# Patient Record
Sex: Male | Born: 1975 | Race: Black or African American | Hispanic: No | Marital: Single | Smoking: Former smoker
Health system: Southern US, Community
[De-identification: ages and names within clinical notes are randomized; demographics above are authoritative.]

## PROBLEM LIST (undated history)

## (undated) DIAGNOSIS — K219 Gastro-esophageal reflux disease without esophagitis: Secondary | ICD-10-CM

## (undated) DIAGNOSIS — R519 Headache, unspecified: Secondary | ICD-10-CM

## (undated) DIAGNOSIS — I82409 Acute embolism and thrombosis of unspecified deep veins of unspecified lower extremity: Secondary | ICD-10-CM

## (undated) DIAGNOSIS — R51 Headache: Secondary | ICD-10-CM

## (undated) HISTORY — DX: Acute embolism and thrombosis of unspecified deep veins of unspecified lower extremity: I82.409

---
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
## 2012-03-18 HISTORY — PX: LIPOMA EXCISION: SHX5283

## 2016-10-29 DIAGNOSIS — S93402A Sprain of unspecified ligament of left ankle, initial encounter: Secondary | ICD-10-CM | POA: Diagnosis not present

## 2016-10-30 ENCOUNTER — Other Ambulatory Visit: Payer: Self-pay | Admitting: Specialist

## 2016-10-30 DIAGNOSIS — S86012A Strain of left Achilles tendon, initial encounter: Secondary | ICD-10-CM | POA: Diagnosis not present

## 2016-10-30 DIAGNOSIS — S86019A Strain of unspecified Achilles tendon, initial encounter: Secondary | ICD-10-CM | POA: Insufficient documentation

## 2016-11-01 ENCOUNTER — Encounter
Admission: RE | Admit: 2016-11-01 | Discharge: 2016-11-01 | Disposition: A | Discharge status: Home / Self Care | Payer: BLUE CROSS/BLUE SHIELD | Source: Ambulatory Visit | Attending: Specialist | Admitting: Specialist

## 2016-11-01 HISTORY — DX: Headache: R51

## 2016-11-01 HISTORY — DX: Headache, unspecified: R51.9

## 2016-11-01 HISTORY — DX: Gastro-esophageal reflux disease without esophagitis: K21.9

## 2016-11-01 NOTE — Patient Instructions (Signed)
  Your procedure is scheduled on: 11-04-16 Report to Same Day Surgery 2nd floor medical mall [facility] Entrance-take elevator on left to 2nd floor.  Check in with surgery information desk.) To find out your arrival time please call [phone] between 1PM - 3PM on 11-01-16  Remember: Instructions that are not followed completely may result in serious medical risk, up to and including death, or upon the discretion of your surgeon and anesthesiologist your surgery may need to be rescheduled.    _x___ 1. Do not eat food or drink liquids after midnight. No gum chewing or hard candies.     __x__ 2. No Alcohol for 24 hours before or after surgery.   __x__3. No Smoking for 24 prior to surgery.   ____  4. Bring all medications with you on the day of surgery if instructed.    __x__ 5. Notify your doctor if there is any change in your medical condition     (cold, fever, infections).     Do not wear jewelry, make-up, hairpins, clips or nail polish.  Do not wear lotions, powders, or perfumes. You may wear deodorant.  Do not shave 48 hours prior to surgery. Men may shave face and neck.  Do not bring valuables to the hospital.    [facility] is not responsible for any belongings or valuables.               Contacts, dentures or bridgework may not be worn into surgery.  Leave your suitcase in the car. After surgery it may be brought to your room.  For patients admitted to the hospital, discharge time is determined by your treatment team.   Patients discharged the day of surgery will not be allowed to drive home.  You will need someone to drive you home and stay with you the night of your procedure.    Please read over the following fact sheets that you were given:   [facility] Preparing for Surgery and or MRSA Information   ____ Take anti-hypertensive (unless it includes a diuretic), cardiac, seizure, asthma,     anti-reflux and psychiatric medicines. These include:  1.  NONE  2.  3.  4.  5.  6.  ____Fleets enema or Magnesium Citrate as directed.   ____ Use CHG Soap or sage wipes as directed on instruction sheet   ____ Use inhalers on the day of surgery and bring to hospital day of surgery  ____ Stop Metformin and Janumet 2 days prior to surgery.    ____ Take 1/2 of usual insulin dose the night before surgery and none on the morning surgery.   ____ Follow recommendations from Cardiologist, Pulmonologist or PCP regarding stopping Aspirin, Coumadin, Pllavix ,Eliquis, Effient, or Pradaxa, and Pletal.  X____Stop Anti-inflammatories such as Advil, Aleve, Ibuprofen, Motrin, Naproxen, Naprosyn, Goodies powders or aspirin products NOW-OK to take Tylenol    ____ Stop supplements until after surgery.     ____ Bring C-Pap to the hospital.

## 2016-11-03 MED ORDER — CEFAZOLIN SODIUM-DEXTROSE 2-4 GM/100ML-% IV SOLN
2.0000 g | INTRAVENOUS | Status: AC
  Administered 2016-11-04: 2 g via INTRAVENOUS

## 2016-11-03 MED ORDER — CLINDAMYCIN PHOSPHATE 600 MG/50ML IV SOLN
600.0000 mg | Freq: Once | INTRAVENOUS | Status: AC
  Administered 2016-11-04: 600 mg via INTRAVENOUS

## 2016-11-04 ENCOUNTER — Ambulatory Visit
Admission: RE | Admit: 2016-11-04 | Discharge: 2016-11-04 | Disposition: A | Discharge status: Home / Self Care | Payer: BLUE CROSS/BLUE SHIELD | Source: Ambulatory Visit | Attending: Specialist | Admitting: Specialist

## 2016-11-04 ENCOUNTER — Ambulatory Visit: Payer: BLUE CROSS/BLUE SHIELD | Admitting: Certified Registered Nurse Anesthetist

## 2016-11-04 ENCOUNTER — Encounter
Admission: RE | Disposition: A | Discharge status: Home / Self Care | Payer: Self-pay | Source: Ambulatory Visit | Attending: Specialist

## 2016-11-04 DIAGNOSIS — X58XXXA Exposure to other specified factors, initial encounter: Secondary | ICD-10-CM | POA: Insufficient documentation

## 2016-11-04 DIAGNOSIS — S86012A Strain of left Achilles tendon, initial encounter: Secondary | ICD-10-CM | POA: Insufficient documentation

## 2016-11-04 HISTORY — PX: ACHILLES TENDON SURGERY: SHX542

## 2016-11-04 SURGERY — REPAIR, TENDON, ACHILLES
Anesthesia: General | Site: Foot | Laterality: Left | Wound class: Clean

## 2016-11-04 MED ORDER — ROCURONIUM BROMIDE 100 MG/10ML IV SOLN
INTRAVENOUS | Status: DC | PRN
  Administered 2016-11-04: 30 mg via INTRAVENOUS

## 2016-11-04 MED ORDER — BUPIVACAINE HCL (PF) 0.5 % IJ SOLN
INTRAMUSCULAR | Status: AC
  Filled 2016-11-04: qty 30

## 2016-11-04 MED ORDER — PROPOFOL 10 MG/ML IV BOLUS
INTRAVENOUS | Status: DC | PRN
  Administered 2016-11-04: 200 mg via INTRAVENOUS

## 2016-11-04 MED ORDER — ONDANSETRON HCL 4 MG/2ML IJ SOLN
INTRAMUSCULAR | Status: AC
  Filled 2016-11-04: qty 2

## 2016-11-04 MED ORDER — GABAPENTIN 400 MG PO CAPS
400.0000 mg | ORAL_CAPSULE | ORAL | Status: AC
  Administered 2016-11-04: 400 mg via ORAL

## 2016-11-04 MED ORDER — FAMOTIDINE 20 MG PO TABS
ORAL_TABLET | ORAL | Status: AC
  Administered 2016-11-04: 20 mg via ORAL
  Filled 2016-11-04: qty 1

## 2016-11-04 MED ORDER — GABAPENTIN 400 MG PO CAPS
ORAL_CAPSULE | ORAL | Status: AC
  Administered 2016-11-04: 400 mg via ORAL
  Filled 2016-11-04: qty 1

## 2016-11-04 MED ORDER — HYDROCODONE-ACETAMINOPHEN 5-325 MG PO TABS: 1.0000 | ORAL_TABLET | Freq: Four times a day (QID) | ORAL | 0 refills | Status: DC | PRN

## 2016-11-04 MED ORDER — SUCCINYLCHOLINE CHLORIDE 20 MG/ML IJ SOLN
INTRAMUSCULAR | Status: DC | PRN
  Administered 2016-11-04: 100 mg via INTRAVENOUS

## 2016-11-04 MED ORDER — MELOXICAM 7.5 MG PO TABS
ORAL_TABLET | ORAL | Status: AC
  Administered 2016-11-04: 15 mg via ORAL
  Filled 2016-11-04: qty 2

## 2016-11-04 MED ORDER — MIDAZOLAM HCL 2 MG/2ML IJ SOLN
INTRAMUSCULAR | Status: DC | PRN
  Administered 2016-11-04: 2 mg via INTRAVENOUS

## 2016-11-04 MED ORDER — GABAPENTIN 400 MG PO CAPS: 400.0000 mg | ORAL_CAPSULE | Freq: Two times a day (BID) | ORAL | 3 refills | Status: DC

## 2016-11-04 MED ORDER — NEOMYCIN-POLYMYXIN B GU 40-200000 IR SOLN
Status: AC
  Filled 2016-11-04: qty 4

## 2016-11-04 MED ORDER — LIDOCAINE HCL (CARDIAC) 20 MG/ML IV SOLN
INTRAVENOUS | Status: DC | PRN
  Administered 2016-11-04: 100 mg via INTRAVENOUS

## 2016-11-04 MED ORDER — ONDANSETRON HCL 4 MG/2ML IJ SOLN: 4.0000 mg | Freq: Once | INTRAMUSCULAR | Status: DC | PRN

## 2016-11-04 MED ORDER — CLINDAMYCIN PHOSPHATE 600 MG/50ML IV SOLN
INTRAVENOUS | Status: AC
  Filled 2016-11-04: qty 50

## 2016-11-04 MED ORDER — FENTANYL CITRATE (PF) 100 MCG/2ML IJ SOLN
INTRAMUSCULAR | Status: AC
  Filled 2016-11-04: qty 2

## 2016-11-04 MED ORDER — ACETAMINOPHEN 10 MG/ML IV SOLN
INTRAVENOUS | Status: AC
  Filled 2016-11-04: qty 100

## 2016-11-04 MED ORDER — CEFAZOLIN SODIUM-DEXTROSE 2-4 GM/100ML-% IV SOLN
INTRAVENOUS | Status: AC
  Filled 2016-11-04: qty 100

## 2016-11-04 MED ORDER — MELOXICAM 7.5 MG PO TABS
15.0000 mg | ORAL_TABLET | Freq: Once | ORAL | Status: AC
  Administered 2016-11-04: 15 mg via ORAL

## 2016-11-04 MED ORDER — FAMOTIDINE 20 MG PO TABS
20.0000 mg | ORAL_TABLET | Freq: Once | ORAL | Status: AC
  Administered 2016-11-04: 20 mg via ORAL

## 2016-11-04 MED ORDER — PROPOFOL 10 MG/ML IV BOLUS
INTRAVENOUS | Status: AC
  Filled 2016-11-04: qty 20

## 2016-11-04 MED ORDER — DEXAMETHASONE SODIUM PHOSPHATE 10 MG/ML IJ SOLN
INTRAMUSCULAR | Status: AC
  Filled 2016-11-04: qty 1

## 2016-11-04 MED ORDER — FENTANYL CITRATE (PF) 100 MCG/2ML IJ SOLN
INTRAMUSCULAR | Status: DC | PRN
  Administered 2016-11-04: 50 ug via INTRAVENOUS
  Administered 2016-11-04 (×2): 100 ug via INTRAVENOUS

## 2016-11-04 MED ORDER — DEXAMETHASONE SODIUM PHOSPHATE 10 MG/ML IJ SOLN
INTRAMUSCULAR | Status: DC | PRN
  Administered 2016-11-04: 10 mg via INTRAVENOUS

## 2016-11-04 MED ORDER — BUPIVACAINE HCL (PF) 0.5 % IJ SOLN
INTRAMUSCULAR | Status: DC | PRN
  Administered 2016-11-04: 30 mL

## 2016-11-04 MED ORDER — NEOMYCIN-POLYMYXIN B GU 40-200000 IR SOLN
Status: DC | PRN
  Administered 2016-11-04: 4 mL

## 2016-11-04 MED ORDER — CHLORHEXIDINE GLUCONATE CLOTH 2 % EX PADS
6.0000 | MEDICATED_PAD | Freq: Once | CUTANEOUS | Status: AC
  Administered 2016-11-04: 6 via TOPICAL

## 2016-11-04 MED ORDER — FENTANYL CITRATE (PF) 100 MCG/2ML IJ SOLN: 25.0000 ug | INTRAMUSCULAR | Status: DC | PRN

## 2016-11-04 MED ORDER — FENTANYL CITRATE (PF) 250 MCG/5ML IJ SOLN
INTRAMUSCULAR | Status: AC
  Filled 2016-11-04: qty 5

## 2016-11-04 MED ORDER — MIDAZOLAM HCL 2 MG/2ML IJ SOLN
INTRAMUSCULAR | Status: AC
  Filled 2016-11-04: qty 2

## 2016-11-04 MED ORDER — LACTATED RINGERS IV SOLN
INTRAVENOUS | Status: DC
  Administered 2016-11-04: 14:00:00 via INTRAVENOUS

## 2016-11-04 MED ORDER — LIDOCAINE HCL (PF) 2 % IJ SOLN
INTRAMUSCULAR | Status: AC
  Filled 2016-11-04: qty 2

## 2016-11-04 MED ORDER — MELOXICAM 15 MG PO TABS: 15.0000 mg | ORAL_TABLET | Freq: Every day | ORAL | 3 refills | Status: DC

## 2016-11-04 MED ORDER — ONDANSETRON HCL 4 MG/2ML IJ SOLN
INTRAMUSCULAR | Status: DC | PRN
  Administered 2016-11-04: 4 mg via INTRAVENOUS

## 2016-11-04 MED ORDER — SUGAMMADEX SODIUM 200 MG/2ML IV SOLN
INTRAVENOUS | Status: DC | PRN
  Administered 2016-11-04: 200 mg via INTRAVENOUS

## 2016-11-04 SURGICAL SUPPLY — 43 items
BNDG COHESIVE 4X5 TAN STRL (GAUZE/BANDAGES/DRESSINGS) ×2 IMPLANT
BNDG ESMARK 6X12 TAN STRL LF (GAUZE/BANDAGES/DRESSINGS) ×2 IMPLANT
CANISTER SUCT 1200ML W/VALVE (MISCELLANEOUS) ×2 IMPLANT
CASTING MATERIAL DELTA LITE (CAST SUPPLIES) ×8 IMPLANT
CHLORAPREP W/TINT 26ML (MISCELLANEOUS) ×2 IMPLANT
CUFF TOURN 24 STER (MISCELLANEOUS) IMPLANT
CUFF TOURN 30 STER DUAL PORT (MISCELLANEOUS) ×2 IMPLANT
ELECT REM PT RETURN 9FT ADLT (ELECTROSURGICAL) ×2
ELECTRODE REM PT RTRN 9FT ADLT (ELECTROSURGICAL) ×1 IMPLANT
GAUZE PETRO XEROFOAM 1X8 (MISCELLANEOUS) ×2 IMPLANT
GAUZE SPONGE 4X4 12PLY STRL (GAUZE/BANDAGES/DRESSINGS) ×2 IMPLANT
GAUZE XEROFORM 4X4 STRL (GAUZE/BANDAGES/DRESSINGS) ×2 IMPLANT
GLOVE BIO SURGEON STRL SZ8 (GLOVE) ×2 IMPLANT
GLOVE INDICATOR 8.0 STRL GRN (GLOVE) ×2 IMPLANT
GLOVE SURG ORTHO 8.5 STRL (GLOVE) ×2 IMPLANT
GOWN STRL REUS W/ TWL LRG LVL3 (GOWN DISPOSABLE) ×1 IMPLANT
GOWN STRL REUS W/TWL LRG LVL3 (GOWN DISPOSABLE) ×1
GOWN STRL REUS W/TWL LRG LVL4 (GOWN DISPOSABLE) ×2 IMPLANT
KIT RM TURNOVER STRD PROC AR (KITS) ×2 IMPLANT
LABEL OR SOLS (LABEL) ×2 IMPLANT
NEEDLE SPNL 20GX3.5 QUINCKE YW (NEEDLE) ×2 IMPLANT
NS IRRIG 1000ML POUR BTL (IV SOLUTION) ×2 IMPLANT
PACK EXTREMITY ARMC (MISCELLANEOUS) ×2 IMPLANT
PADDING CAST 4IN STRL (MISCELLANEOUS) ×2
PADDING CAST BLEND 4X4 STRL (MISCELLANEOUS) ×2 IMPLANT
PADDING CAST BLEND 6X4 STRL (MISCELLANEOUS) ×2 IMPLANT
PADDING STRL CAST 6IN (MISCELLANEOUS) ×2
SOL PREP PVP 2OZ (MISCELLANEOUS) ×2
SOLUTION PREP PVP 2OZ (MISCELLANEOUS) ×1 IMPLANT
SPONGE LAP 18X18 5 PK (GAUZE/BANDAGES/DRESSINGS) ×2 IMPLANT
STAPLER SKIN PROX 35W (STAPLE) ×2 IMPLANT
STOCKINETTE BIAS CUT 6 980064 (GAUZE/BANDAGES/DRESSINGS) ×2 IMPLANT
STOCKINETTE STRL 6IN 960660 (GAUZE/BANDAGES/DRESSINGS) ×2 IMPLANT
SUT DVC QUILL MONODERM 30X30 (SUTURE) ×2 IMPLANT
SUT ETHIBOND 3-0  EXTR (SUTURE) ×1
SUT ETHIBOND 3-0 EXTR (SUTURE) ×1 IMPLANT
SUT FIBERWIRE #2 38 BLUE 1/2 (SUTURE) ×2
SUT ORTHOCORD OS-6 NDL 36 (SUTURE) ×2 IMPLANT
SUT QUILL PDO 0 36 36 VIOLET (SUTURE) ×2 IMPLANT
SUT VIC AB 3-0 SH 27 (SUTURE) ×1
SUT VIC AB 3-0 SH 27X BRD (SUTURE) ×1 IMPLANT
SUTURE FIBERWR #2 38 BLUE 1/2 (SUTURE) ×1 IMPLANT
TAPE CAST 4X4 WHT DELT (MISCELLANEOUS) ×8 IMPLANT

## 2016-11-04 NOTE — Anesthesia Postprocedure Evaluation (Signed)
Anesthesia Post Note  Patient: Frank Marsh  Procedure(s) Performed: Procedure(s) (LRB): ACHILLES TENDON REPAIR (Left)  Patient location during evaluation: PACU Anesthesia Type: General Level of consciousness: awake and alert and oriented Pain management: pain level controlled Vital Signs Assessment: post-procedure vital signs reviewed and stable Respiratory status: spontaneous breathing, nonlabored ventilation and respiratory function stable Cardiovascular status: blood pressure returned to baseline and stable Postop Assessment: no signs of nausea or vomiting Anesthetic complications: no     Last Vitals:  Vitals:   11/04/16 1646 11/04/16 1657  BP: (!) 143/89 (!) 145/80  Pulse: 84 80  Resp: 20 18  Temp:  36.5 C  SpO2: 95% 96%    Last Pain:  Vitals:   11/04/16 1657  TempSrc: Temporal  PainSc:                  [name]

## 2016-11-04 NOTE — Anesthesia Post-op Follow-up Note (Signed)
Anesthesia QCDR form completed.        

## 2016-11-04 NOTE — Op Note (Signed)
11/04/2016  3:49 PM  PATIENT:  Frank Marsh    PRE-OPERATIVE DIAGNOSIS:  S86.012A Strain of left Achilles tendon, initial encounter  POST-OPERATIVE DIAGNOSIS:  Same  PROCEDURE:  ACHILLES TENDON REPAIR  SURGEON:  Park Breed, MD  ANESTHESIA:   General, prone  PREOPERATIVE INDICATIONS:  Frank Marsh is a  41 y.o. male with a diagnosis of S86.012A Strain of left Achilles tendon, initial encounter who was diagnosed with a complete Achilles tendon rupture and elected for surgical management.    The risks benefits and alternatives were discussed with the patient preoperatively including but not limited to the risks of infection, bleeding, nerve injury, cardiopulmonary complications, the need for revision surgery, among others, and the patient was willing to proceed.  EBL: None  TOURNIQUET TIME: 32  MIN  OPERATIVE IMPLANTS: None  OPERATIVE FINDINGS: Complete rupture of left Achilles at musculotendinous junction  OPERATIVE PROCEDURE: The patient was brought to the operating room and underwent general endotracheal anesthesia.  The patient was then turned into the prone position on the operating room table and padded appropriately.  The operative leg was prepped and draped in a sterile fashion and an Esmarch bandage applied.  Tourniquet was inflated to 350 mmHg.  A posterior lateral incision was made along the distal aspect of the Achilles tendon.  Dissection was carried out bluntly through subcutaneous tissue, preserving veins and a large sural  nerve.  The tendon sheath was then incised longitudinally after freeing it up from adhesions.  As much sheath as possible was preserved.  The Achilles tendon was seen to be completely ruptured.  The wound was irrigated free of debris and blood.  The tendon ends were debrided with scissors to remove frayed tissue.  Once this was accomplished, the tendon ends were reapproximated using a #2 Fiberwire suture in a modified Lister fashion, which was tied  securely.  This provided good continuity.  The tendon ends were then sutured further with #2 Fiberwire suture. A circumferential 2-0 vicryl was placed.  After further irrigation, the tendon sheath was closed with 2-0 Vicryl suture. Subcutaneous tissue was closed with 3-0 vicryl. The skin was closed with staples.  Half percent Sensorcaine was placed into the wound.  A dry sterile dressing with a well-padded short-leg cast was applied.  The ankle was allowed to drop into a comfortable amount of equinus.  Tourniquet was deflated with good return of blood flow to the foot.  Sponge and needle counts were correct.  Patient was awakened and returned to the supine position on a stretcher and taken to recovery in good condition.    Park Breed, MD

## 2016-11-04 NOTE — Anesthesia Preprocedure Evaluation (Addendum)
Anesthesia Evaluation  Patient identified by MRN, date of birth, ID band Patient awake    Reviewed: Allergy & Precautions, NPO status , Patient's Chart, lab work & pertinent test results, reviewed documented beta blocker date and time   Airway Mallampati: II  TM Distance: >3 FB     Dental  (+) Chipped, Missing   Pulmonary former smoker,           Cardiovascular      Neuro/Psych  Headaches,    GI/Hepatic GERD  Controlled,  Endo/Other    Renal/GU      Musculoskeletal   Abdominal   Peds  Hematology   Anesthesia Other Findings One tooth missing.  Reproductive/Obstetrics                            Anesthesia Physical Anesthesia Plan  ASA: III  Anesthesia Plan: General   Post-op Pain Management:    Induction: Intravenous  PONV Risk Score and Plan:   Airway Management Planned: Oral ETT and LMA  Additional Equipment:   Intra-op Plan:   Post-operative Plan:   Informed Consent: I have reviewed the patients History and Physical, chart, labs and discussed the procedure including the risks, benefits and alternatives for the proposed anesthesia with the patient or authorized representative who has indicated his/her understanding and acceptance.     Plan Discussed with: CRNA  Anesthesia Plan Comments:         Anesthesia Quick Evaluation

## 2016-11-04 NOTE — Transfer of Care (Signed)
Immediate Anesthesia Transfer of Care Note  Patient: Frank Marsh  Procedure(s) Performed: Procedure(s): ACHILLES TENDON REPAIR (Left)  Patient Location: PACU  Anesthesia Type:General  Level of Consciousness: drowsy and patient cooperative  Airway & Oxygen Therapy: Patient Spontanous Breathing and Patient connected to face mask oxygen  Post-op Assessment: Report given to RN and Post -op Vital signs reviewed and stable  Post vital signs: Reviewed and stable  Last Vitals:  Vitals:   11/04/16 1337 11/04/16 1605  BP: 138/79 132/87  Pulse: 88 84  Resp: 18 17  Temp: 37 C 36.5 C  SpO2: 99% 98%    Last Pain:  Vitals:   11/04/16 1605  TempSrc: Temporal  PainSc: 0-No pain         Complications: No apparent anesthesia complications

## 2016-11-04 NOTE — H&P (Signed)
THE PATIENT WAS SEEN PRIOR TO SURGERY TODAY.  HISTORY, ALLERGIES, HOME MEDICATIONS AND OPERATIVE PROCEDURE WERE REVIEWED. RISKS AND BENEFITS OF SURGERY DISCUSSED WITH PATIENT AGAIN.  NO CHANGES FROM INITIAL HISTORY AND PHYSICAL NOTED.    

## 2016-11-04 NOTE — Anesthesia Procedure Notes (Signed)
Procedure Name: Intubation Date/Time: 11/04/2016 2:50 PM Performed by: Darlyne Russian Pre-anesthesia Checklist: Patient identified, Emergency Drugs available, Suction available, Patient being monitored and Timeout performed Patient Re-evaluated:Patient Re-evaluated prior to induction Oxygen Delivery Method: Circle system utilized Preoxygenation: Pre-oxygenation with 100% oxygen Induction Type: IV induction Ventilation: Mask ventilation without difficulty Laryngoscope Size: Mac and 4 Grade View: Grade III Tube type: Oral Tube size: 7.5 mm Number of attempts: 1 Airway Equipment and Method: Stylet Secured at: 21 cm Tube secured with: Tape Dental Injury: Teeth and Oropharynx as per pre-operative assessment

## 2016-11-04 NOTE — Progress Notes (Signed)
Elevated leg on pillows

## 2016-11-05 ENCOUNTER — Encounter: Payer: Self-pay | Admitting: Specialist

## 2016-11-07 ENCOUNTER — Ambulatory Visit: Payer: Self-pay | Admitting: Family Medicine

## 2016-11-19 DIAGNOSIS — S86012A Strain of left Achilles tendon, initial encounter: Secondary | ICD-10-CM | POA: Diagnosis not present

## 2016-11-23 ENCOUNTER — Emergency Department: Payer: BLUE CROSS/BLUE SHIELD

## 2016-11-23 ENCOUNTER — Emergency Department
Admission: EM | Admit: 2016-11-23 | Discharge: 2016-11-23 | Disposition: A | Discharge status: Home / Self Care | Payer: BLUE CROSS/BLUE SHIELD | Attending: Emergency Medicine | Admitting: Emergency Medicine

## 2016-11-23 DIAGNOSIS — I82409 Acute embolism and thrombosis of unspecified deep veins of unspecified lower extremity: Secondary | ICD-10-CM | POA: Insufficient documentation

## 2016-11-23 DIAGNOSIS — Z87891 Personal history of nicotine dependence: Secondary | ICD-10-CM | POA: Diagnosis not present

## 2016-11-23 DIAGNOSIS — I8290 Acute embolism and thrombosis of unspecified vein: Secondary | ICD-10-CM | POA: Diagnosis not present

## 2016-11-23 DIAGNOSIS — M79662 Pain in left lower leg: Secondary | ICD-10-CM | POA: Diagnosis not present

## 2016-11-23 DIAGNOSIS — M79605 Pain in left leg: Secondary | ICD-10-CM | POA: Diagnosis not present

## 2016-11-23 DIAGNOSIS — Z79899 Other long term (current) drug therapy: Secondary | ICD-10-CM | POA: Insufficient documentation

## 2016-11-23 MED ORDER — RIVAROXABAN (XARELTO) VTE STARTER PACK (15 & 20 MG): ORAL_TABLET | ORAL | 0 refills | Status: DC

## 2016-11-23 NOTE — ED Notes (Signed)
Patient transported to Ultrasound 

## 2016-11-23 NOTE — ED Triage Notes (Signed)
Pt c/o left groin pain worse with movement X 3 days. Pt has cast on left lower extremity after achilles tendon surgery in August. No redness or swelling to groin.

## 2016-11-23 NOTE — ED Provider Notes (Signed)
I was consulted by PA regarding if need to remove cast and whether OK to discharge on Xarelto (as opposed to inpatient anticoag).  We discussed to make sure no lower leg pain or tingling in the cast -- I was told No by PA, and in that case I think could be reasonable to leave cast in place and recommend to patient any symptoms at the leg in the cast to be seen immediately for reevaluation.  New DVT, otherwise stable without symptoms concerning for PE is generally ok for outpatient initiation of anticoagulation.   Lisa Roca, MD 11/23/16 [phone]

## 2016-11-23 NOTE — ED Provider Notes (Signed)
[facility] Emergency Department Provider Note  ____________________________________________  Time seen: Approximately 10:19 AM  I have reviewed the triage vital signs and the nursing notes.   HISTORY  Chief Complaint Groin Pain    HPI Frank Marsh is a 41 y.o. male that presents to the EDfor evaluation of left leg pain for 4 days. Patient had achilles tendon repair in August. Patient had staples removed on Tuesday and had his cast replaced. Pain started in his calf and is now on the inside of his left thigh. Pain is worse with movement. No new injury. No SOB, CP, nausea, vomiting, abdominal pain, numbness, tingling.   Past Medical History:  Diagnosis Date  . GERD (gastroesophageal reflux disease)    OCC-NO MEDS  . Headache    OCC    There are no active problems to display for this patient.   Past Surgical History:  Procedure Laterality Date  . ACHILLES TENDON SURGERY Left 11/04/2016   Procedure: ACHILLES TENDON REPAIR;  Surgeon: Earnestine Leys, MD;  Location: [facility] ORS;  Service: Orthopedics;  Laterality: Left;  . HERNIA REPAIR     AS A BABY  . LIPOMA EXCISION Right    LEG    Prior to Admission medications   Medication Sig Start Date End Date Taking? Authorizing Provider  acetaminophen (TYLENOL) 325 MG tablet Take 650 mg by mouth every 4 (four) hours as needed.    [provider]  gabapentin (NEURONTIN) 400 MG capsule Take 1 capsule (400 mg total) by mouth 2 (two) times daily. 11/04/16   Earnestine Leys, MD  HYDROcodone-acetaminophen (NORCO) 5-325 MG tablet Take 1 tablet by mouth every 6 (six) hours as needed. 11/04/16   Earnestine Leys, MD  meloxicam (MOBIC) 15 MG tablet Take 1 tablet (15 mg total) by mouth daily. 11/04/16   Earnestine Leys, MD  Rivaroxaban 15 & 20 MG TBPK Take as directed on package: Start with one 15mg  tablet by mouth twice a day with food. On Day 22, switch to one 20mg  tablet once a day with food. 11/23/16   Laban Emperor,  PA-C    Allergies Patient has no known allergies.  No family history on file.  Social History Social History  Substance Use Topics  . Smoking status: Former Smoker    Packs/day: 1.00    Years: 15.00    Types: Cigarettes    Quit date: 11/01/2009  . Smokeless tobacco: Never Used  . Alcohol use No     Review of Systems  Constitutional: No fever/chills Cardiovascular: No chest pain. Respiratory: No cough. No SOB. Gastrointestinal: No abdominal pain.  No nausea, no vomiting.  Skin: Negative for rash, abrasions, lacerations, ecchymosis. Neurological: Negative for headaches, numbness or tingling   ____________________________________________   PHYSICAL EXAM:  VITAL SIGNS: ED Triage Vitals  Enc Vitals Group     BP 11/23/16 1000 (!) 149/82     Pulse Rate 11/23/16 1000 94     Resp 11/23/16 1000 18     Temp 11/23/16 1000 98.7 F (37.1 C)     Temp Source 11/23/16 1000 Oral     SpO2 11/23/16 1000 98 %     Weight 11/23/16 1001 225 lb (102.1 kg)     Height 11/23/16 1001 5\' 10"  (1.778 m)     Head Circumference --      Peak Flow --      Pain Score 11/23/16 1000 8     Pain Loc --      Pain Edu? --  Excl. in [place]? --      Constitutional: Alert and oriented. Well appearing and in no acute distress. Eyes: Conjunctivae are normal. PERRL. EOMI. Head: Atraumatic. ENT:      Ears:      Nose: No congestion/rhinnorhea.      Mouth/Throat: Mucous membranes are moist.  Neck: No stridor.  Cardiovascular: Normal rate, regular rhythm.  Good peripheral circulation. Respiratory: Normal respiratory effort without tachypnea or retractions. Lungs CTAB. Good air entry to the bases with no decreased or absent breath sounds. Gastrointestinal: Bowel sounds 4 quadrants. Soft and nontender to palpation. No guarding or rigidity. No palpable masses. No distention.  Musculoskeletal:  No gross deformities appreciated.   Tenderness to palpation over left adductor muscles. Cast in place. No visible  swelling or erythema. Sensation of toes and cap refill intact. Neurologic:  Normal speech and language. No gross focal neurologic deficits are appreciated.  Skin:  Skin is warm, dry and intact. No rash noted.   ____________________________________________   LABS (all labs ordered are listed, but only abnormal results are displayed)  Labs Reviewed - No data to display ____________________________________________  EKG   ____________________________________________  RADIOLOGY  US Venous Img Lower Unilateral Left  Result Date: 11/23/2016 CLINICAL DATA:  Left lower extremity pain for the past 3 days. Recent history of left leg surgery (11/04/2016). Evaluate for DVT. EXAM: LEFT LOWER EXTREMITY VENOUS DOPPLER ULTRASOUND TECHNIQUE: Gray-scale sonography with graded compression, as well as color Doppler and duplex ultrasound were performed to evaluate the lower extremity deep venous systems from the level of the common femoral vein and including the common femoral, femoral, profunda femoral, popliteal and calf veins including the posterior tibial, peroneal and gastrocnemius veins when visible. The superficial great saphenous vein was also interrogated. Spectral Doppler was utilized to evaluate flow at rest and with distal augmentation maneuvers in the common femoral, femoral and popliteal veins. COMPARISON:  None. FINDINGS: Contralateral Common Femoral Vein: Respiratory phasicity is normal and symmetric with the symptomatic side. No evidence of thrombus. Normal compressibility. While the deep femoral vein as well as the saphenofemoral junction are widely patent (images 4 and 19), there is nonocclusive thrombus within the left common femoral vein (image 7). There is occlusive mixed echogenic DVT seen throughout the imaged course of the left femoral vein (image 22, 25 and 28), extending to involve the imaged portions of the left popliteal vein (images 31, 33 and 37). The calf veins were not imaged  secondary to patient's overlying cast. Other Findings:  None. IMPRESSION: The examination is positive for extensive largely occlusive thrombus extending from the left common femoral vein through the left popliteal vein. Electronically Signed   By: Sandi Mariscal M.D.   On: 11/23/2016 12:24    ____________________________________________    PROCEDURES  Procedure(s) performed:    Procedures    Medications - No data to display   ____________________________________________   INITIAL IMPRESSION / ASSESSMENT AND PLAN / ED COURSE  Pertinent labs & imaging results that were available during my care of the patient were reviewed by me and considered in my medical decision making (see chart for details).  Review of the [place] CSRS was performed in accordance of the [place] prior to dispensing any controlled drugs.   Patient's diagnosis is consistent with DVT. Vital signs and exam are reassuring. Ultrasound consistent with large occlusive venous thrombosis. Dr. Reita Cliche recommended that cast be left in place and patient be sent home on blood thinners. Patient will be discharged home with prescriptions for Xarelto.  Patient is to follow up with PCP and orthopedics as directed. Patient is given ED precautions to return to the ED for any worsening or new symptoms.     ____________________________________________  FINAL CLINICAL IMPRESSION(S) / ED DIAGNOSES  Final diagnoses:  Thrombosis      NEW MEDICATIONS STARTED DURING THIS VISIT:  Discharge Medication List as of 11/23/2016  1:18 PM    START taking these medications   Details  Rivaroxaban 15 & 20 MG TBPK Take as directed on package: Start with one 15mg  tablet by mouth twice a day with food. On Day 22, switch to one 20mg  tablet once a day with food., Print            This chart was dictated using voice recognition software/Dragon. Despite best efforts to proofread, errors can occur which can change the meaning. Any change was purely  unintentional.    Laban Emperor, PA-C 11/23/16 1613    Arta Silence, MD 11/23/16 [phone]

## 2016-11-23 NOTE — ED Notes (Signed)
Pt alert and oriented X4, active, cooperative, pt in NAD. RR even and unlabored, color WNL.  Pt informed to return if any life threatening symptoms occur.   

## 2016-11-26 ENCOUNTER — Encounter: Payer: Self-pay | Admitting: Physician Assistant

## 2016-11-26 ENCOUNTER — Ambulatory Visit (INDEPENDENT_AMBULATORY_CARE_PROVIDER_SITE_OTHER): Payer: BLUE CROSS/BLUE SHIELD | Admitting: Physician Assistant

## 2016-11-26 VITALS — BP 108/60 | HR 68 | Temp 98.5°F | Resp 16 | Ht 69.0 in | Wt 225.0 lb

## 2016-11-26 DIAGNOSIS — I82409 Acute embolism and thrombosis of unspecified deep veins of unspecified lower extremity: Secondary | ICD-10-CM | POA: Insufficient documentation

## 2016-11-26 DIAGNOSIS — I82412 Acute embolism and thrombosis of left femoral vein: Secondary | ICD-10-CM | POA: Diagnosis not present

## 2016-11-26 MED ORDER — RIVAROXABAN 20 MG PO TABS: 20.0000 mg | ORAL_TABLET | Freq: Every day | ORAL | 0 refills | Status: DC

## 2016-11-26 NOTE — Progress Notes (Signed)
Patient: Frank Marsh Male    DOB: 08-06-1975   41 y.o.   MRN: [id number] Visit Date: 11/26/2016  Today's Provider: Trinna Post, PA-C   Chief Complaint  Patient presents with  . Establish Care  . Follow-up    ER follow up   Subjective:    HPI   Frank Marsh is a 41 y/o male with history of left achilles tendon repair on 11/04/2016 with recent left femoral DVT on presentation to the ER on 11/23/2016 here today for follow up. Dr. Sabra Heck performed his achilles tendon repair on 11/04/2016. He was discharged on Norco, ASA, and Mobic. He presented to the ER on 11/23/2016 with left leg pain and slight swelling. US revealed left femoral and left popliteal DVT. He did not have any SOB or CP at the time. He was started on Xarelto starter pack at 15 mg BID daily for 3 weeks, then increasing to 20 mg daily.  Today, he is reporting some mild left leg pain, but no extreme swelling, pain, numbness, or tingling in his lower left extremity. He has no chest pain or SOB. He is no longer taking Aspirin. He has taken Excedrin for headaches. Has one or two pills of Mobic left. Does not have history of blood clots or bleeding disorders. No active cancer.       No Known Allergies   Current Outpatient Prescriptions:  .  acetaminophen (TYLENOL) 325 MG tablet, Take 650 mg by mouth every 4 (four) hours as needed., Disp: , Rfl:  .  gabapentin (NEURONTIN) 400 MG capsule, Take 1 capsule (400 mg total) by mouth 2 (two) times daily., Disp: 60 capsule, Rfl: 3 .  meloxicam (MOBIC) 15 MG tablet, Take 1 tablet (15 mg total) by mouth daily., Disp: 30 tablet, Rfl: 3 .  Rivaroxaban 15 & 20 MG TBPK, Take as directed on package: Start with one 15mg  tablet by mouth twice a day with food. On Day 22, switch to one 20mg  tablet once a day with food., Disp: 51 each, Rfl: 0 .  HYDROcodone-acetaminophen (NORCO) 5-325 MG tablet, Take 1 tablet by mouth every 6 (six) hours as needed. (Patient not taking: Reported on 11/26/2016),  Disp: 40 tablet, Rfl: 0  Review of Systems  Constitutional: Negative.   HENT: Negative.   Eyes: Negative.   Respiratory: Negative.   Cardiovascular: Positive for leg swelling. Negative for chest pain and palpitations.  Gastrointestinal: Positive for constipation.  Endocrine: Negative.   Genitourinary: Negative.   Musculoskeletal: Negative.   Skin: Negative.   Neurological: Negative.   Hematological: Negative.   Psychiatric/Behavioral: Negative.    Family History  Problem Relation Age of Onset  . Healthy Mother   . Mesothelioma Father   . Healthy Sister   . Healthy Brother   . Hypertension Maternal Grandmother   . Prostate cancer Paternal Grandfather   . Healthy Brother    Past Surgical History:  Procedure Laterality Date  . ACHILLES TENDON SURGERY Left 11/04/2016   Procedure: ACHILLES TENDON REPAIR;  Surgeon: Earnestine Leys, MD;  Location: [facility] ORS;  Service: Orthopedics;  Laterality: Left;  . HERNIA REPAIR     AS A BABY  . LIPOMA EXCISION Right    LEG     Social History  Substance Use Topics  . Smoking status: Former Smoker    Packs/day: 1.00    Years: 15.00    Types: Cigarettes    Quit date: 11/01/2009  . Smokeless tobacco: Never Used  . Alcohol  use No   Objective:   BP 108/60 (BP Location: Left Arm, Patient Position: Sitting, Cuff Size: Large)   Pulse 68   Temp 98.5 F (36.9 C) (Oral)   Resp 16   Ht 5\' 9"  (1.753 m)   Wt 225 lb (102.1 kg)   BMI 33.23 kg/m  Vitals:   11/26/16 0923  BP: 108/60  Pulse: 68  Resp: 16  Temp: 98.5 F (36.9 C)  TempSrc: Oral  Weight: 225 lb (102.1 kg)  Height: 5\' 9"  (1.753 m)     Physical Exam  Constitutional: He is oriented to person, place, and time. He appears well-developed and well-nourished.  Cardiovascular: Normal rate and regular rhythm.   Pulmonary/Chest: Effort normal and breath sounds normal.  Musculoskeletal: He exhibits edema. He exhibits no tenderness or deformity.  There is minimal swelling and  redness in left groin area, no warmth appreciated. Left and right thigh measure 63 cm in diameter when measured at the same level. Sensation is in tact to distal BLE. No redness or swelling appreciated of casted left foot.  Neurological: He is alert and oriented to person, place, and time. No sensory deficit.  Skin: Skin is warm and dry.  Psychiatric: He has a normal mood and affect. His behavior is normal.        Assessment & Plan:     1. Acute deep vein thrombosis (DVT) of femoral vein of left lower extremity ([place])  Patient with DVT of left common femoral, no respiratory or cardiac symptoms today. Seems to be improving. Started appropriately on Xarelto. Will prescribe 20 mg tablets of xarelto to be continued after his starter pack. Expect anticoagulation for provoked DVT to continue between 3- 6 months. He has appointment with Dr. Jacinto Reap on 9/21 that he should keep. Advised to hold ASA and Mobic.  - rivaroxaban (XARELTO) 20 MG TABS tablet; Take 1 tablet (20 mg total) by mouth daily with supper.  Dispense: 30 tablet; Refill: 0  Return if symptoms worsen or fail to improve, for DR B.  The entirety of the information documented in the History of Present Illness, Review of Systems and Physical Exam were personally obtained by me. Portions of this information were initially documented by Ashley Royalty, CMA and reviewed by me for thoroughness and accuracy.        Trinna Post, PA-C  [facility]

## 2016-11-26 NOTE — Patient Instructions (Signed)
Deep Vein Thrombosis A deep vein thrombosis (DVT) is a blood clot (thrombus) that usually occurs in a deep, larger vein of the lower leg or the pelvis, or in an upper extremity such as the arm. These are dangerous and can lead to serious and even life-threatening complications if the clot travels to the lungs. A DVT can damage the valves in your leg veins so that instead of flowing upward, the blood pools in the lower leg. This is called post-thrombotic syndrome, and it can result in pain, swelling, discoloration, and sores on the leg. What are the causes? A DVT is caused by the formation of a blood clot in your leg, pelvis, or arm. Usually, several things contribute to the formation of blood clots. A clot may develop when:  Your blood flow slows down.  Your vein becomes damaged in some way.  You have a condition that makes your blood clot more easily.  What increases the risk? A DVT is more likely to develop in:  People who are older, especially over 60 years of age.  People who are overweight (obese).  People who sit or lie still for a long time, such as during long-distance travel (over 4 hours), bed rest, hospitalization, or during recovery from certain medical conditions like a stroke.  People who do not engage in much physical activity (sedentary lifestyle).  People who have chronic breathing disorders.  People who have a personal or family history of blood clots or blood clotting disease.  People who have peripheral vascular disease (PVD), diabetes, or some types of cancer.  People who have heart disease, especially if the person had a recent heart attack or has congestive heart failure.  People who have neurological diseases that affect the legs (leg paresis).  People who have had a traumatic injury, such as breaking a hip or leg.  People who have recently had major or lengthy surgery, especially on the hip, knee, or abdomen.  People who have had a central line placed  inside a large vein.  People who take medicines that contain the hormone estrogen. These include birth control pills and hormone replacement therapy.  Pregnancy or during childbirth or the postpartum period.  Long plane flights (over 8 hours).  What are the signs or symptoms?  Symptoms of a DVT can include:  Swelling of your leg or arm, especially if one side is much worse.  Warmth and redness of your leg or arm, especially if one side is much worse.  Pain in your arm or leg. If the clot is in your leg, symptoms may be more noticeable or worse when you stand or walk.  A feeling of pins and needles, if the clot is in the arm.  The symptoms of a DVT that has traveled to the lungs (pulmonary embolism, PE) usually start suddenly and include:  Shortness of breath while active or at rest.  Coughing or coughing up blood or blood-tinged mucus.  Chest pain that is often worse with deep breaths.  Rapid or irregular heartbeat.  Feeling light-headed or dizzy.  Fainting.  Feeling anxious.  Sweating.  There may also be pain and swelling in a leg if that is where the blood clot started. These symptoms may represent a serious problem that is an emergency. Do not wait to see if the symptoms will go away. Get medical help right away. Call your local emergency services (911 in the [place] Do not drive yourself to the hospital. How is this diagnosed? Your health   care provider will take a medical history and perform a physical exam. You may also have other tests, including:  Blood tests to assess the clotting properties of your blood.  Imaging tests, such as CT, ultrasound, MRI, X-ray, and other tests to see if you have clots anywhere in your body.  How is this treated? After a DVT is identified, it can be treated. The type of treatment that you receive depends on many factors, such as the cause of your DVT, your risk for bleeding or developing more clots, and other medical conditions that  you have. Sometimes, a combination of treatments is necessary. Treatment options may be combined and include:  Monitoring the blood clot with ultrasound.  Taking medicines by mouth, such as newer blood thinners (anticoagulants), thrombolytics, or warfarin.  Taking anticoagulant medicine by injection or through an IV tube.  Wearing compression stockings or using different types ofdevices.  Surgery (rare) to remove the blood clot or to place a filter in your abdomen to stop the blood clot from traveling to your lungs.  Treatments for a DVT are often divided into immediate treatment and long-term treatment (up to 3 months after DVT). You can work with your health care provider to choose the treatment program that is best for you. Follow these instructions at home: If you are taking a newer oral anticoagulant:  Take the medicine every single day at the same time each day.  Understand what foods and drugs interact with this medicine.  Understand that there are no regular blood tests required when using this medicine.  Understand the side effects of this medicine, including excessive bruising or bleeding. Ask your health care provider or pharmacist about other possible side effects. If you are taking warfarin:  Understand how to take warfarin and know which foods can affect how warfarin works in your body.  Understand that it is dangerous to take too much or too little warfarin. Too much warfarin increases the risk of bleeding. Too little warfarin continues to allow the risk for blood clots.  Follow your PT and INR blood testing schedule. The PT and INR results allow your health care provider to adjust your dose of warfarin. It is very important that you have your PT and INR tested as often as told by your health care provider.  Avoid major changes in your diet, or tell your health care provider before you change your diet. Arrange a visit with a registered dietitian to answer your  questions. Many foods, especially foods that are high in vitamin K, can interfere with warfarin and affect the PT and INR results. Eat a consistent amount of foods that are high in vitamin K, such as: ? Spinach, kale, broccoli, cabbage, collard greens, turnip greens, Brussels sprouts, peas, cauliflower, seaweed, and parsley. ? Beef liver and pork liver. ? Green tea. ? Soybean oil.  Tell your health care provider about any and all medicines, vitamins, and supplements that you take, including aspirin and other over-the-counter anti-inflammatory medicines. Be especially cautious with aspirin and anti-inflammatory medicines. Do not take those before you ask your health care provider if it is safe to do so. This is important because many medicines can interfere with warfarin and affect the PT and INR results.  Do not start or stop taking any over-the-counter or prescription medicine unless your health care provider or pharmacist tells you to do so. If you take warfarin, you will also need to do these things:  Hold pressure over cuts for longer than   usual.  Tell your dentist and other health care providers that you are taking warfarin before you have any procedures in which bleeding may occur.  Avoid alcohol or drink very small amounts. Tell your health care provider if you change your alcohol intake.  Do not use tobacco products, including cigarettes, chewing tobacco, and e-cigarettes. If you need help quitting, ask your health care provider.  Avoid contact sports.  General instructions  Take over-the-counter and prescription medicines only as told by your health care provider. Anticoagulant medicines can have side effects, including easy bruising and difficulty stopping bleeding. If you are prescribed an anticoagulant, you will also need to do these things: ? Hold pressure over cuts for longer than usual. ? Tell your dentist and other health care providers that you are taking anticoagulants  before you have any procedures in which bleeding may occur. ? Avoid contact sports.  Wear a medical alert bracelet or carry a medical alert card that says you have had a PE.  Ask your health care provider how soon you can go back to your normal activities. Stay active to prevent new blood clots from forming.  Make sure to exercise while traveling or when you have been sitting or standing for a long period of time. It is very important to exercise. Exercise your legs by walking or by tightening and relaxing your leg muscles often. Take frequent walks.  Wear compression stockings as told by your health care provider to help prevent more blood clots from forming.  Do not use tobacco products, including cigarettes, chewing tobacco, and e-cigarettes. If you need help quitting, ask your health care provider.  Keep all follow-up appointments with your health care provider. This is important. How is this prevented? Take these actions to decrease your risk of developing another DVT:  Exercise regularly. For at least 30 minutes every day, engage in: ? Activity that involves moving your arms and legs. ? Activity that encourages good blood flow through your body by increasing your heart rate.  Exercise your arms and legs every hour during long-distance travel (over 4 hours). Drink plenty of water and avoid drinking alcohol while traveling.  Avoid sitting or lying in bed for long periods of time without moving your legs.  Maintain a weight that is appropriate for your height. Ask your health care provider what weight is healthy for you.  If you are a woman who is over 35 years of age, avoid unnecessary use of medicines that contain estrogen. These include birth control pills.  Do not smoke, especially if you take estrogen medicines. If you need help quitting, ask your health care provider.  If you are hospitalized, prevention measures may include:  Early walking after surgery, as soon as your  health care provider says that it is safe.  Receiving anticoagulants to prevent blood clots.If you cannot take anticoagulants, other options may be available, such as wearing compression stockings or using different types of devices.  Get help right away if:  You have new or increased pain, swelling, or redness in an arm or leg.  You have numbness or tingling in an arm or leg.  You have shortness of breath while active or at rest.  You have chest pain.  You have a rapid or irregular heartbeat.  You feel light-headed or dizzy.  You cough up blood.  You notice blood in your vomit, bowel movement, or urine. These symptoms may represent a serious problem that is an emergency. Do not wait to see   if the symptoms will go away. Get medical help right away. Call your local emergency services (911 in the [place] Do not drive yourself to the hospital. This information is not intended to replace advice given to you by your health care provider. Make sure you discuss any questions you have with your health care provider. Document Released: 03/04/2005 Document Revised: 08/10/2015 Document Reviewed: 06/29/2014 Elsevier Interactive Patient Education  2017 [employer] Inc[employer]  

## 2016-12-06 ENCOUNTER — Ambulatory Visit: Payer: Self-pay | Admitting: Family Medicine

## 2016-12-16 DIAGNOSIS — S86012A Strain of left Achilles tendon, initial encounter: Secondary | ICD-10-CM | POA: Diagnosis not present

## 2016-12-16 DIAGNOSIS — S86012D Strain of left Achilles tendon, subsequent encounter: Secondary | ICD-10-CM | POA: Diagnosis not present

## 2016-12-23 DIAGNOSIS — M25672 Stiffness of left ankle, not elsewhere classified: Secondary | ICD-10-CM | POA: Diagnosis not present

## 2016-12-23 DIAGNOSIS — R262 Difficulty in walking, not elsewhere classified: Secondary | ICD-10-CM | POA: Diagnosis not present

## 2016-12-30 ENCOUNTER — Encounter: Payer: Self-pay | Admitting: Family Medicine

## 2016-12-30 ENCOUNTER — Ambulatory Visit (INDEPENDENT_AMBULATORY_CARE_PROVIDER_SITE_OTHER): Payer: BLUE CROSS/BLUE SHIELD | Admitting: Family Medicine

## 2016-12-30 VITALS — BP 122/68 | HR 62 | Temp 98.1°F | Resp 16 | Ht 69.0 in | Wt 228.0 lb

## 2016-12-30 DIAGNOSIS — Z23 Encounter for immunization: Secondary | ICD-10-CM | POA: Diagnosis not present

## 2016-12-30 DIAGNOSIS — Z0000 Encounter for general adult medical examination without abnormal findings: Secondary | ICD-10-CM | POA: Diagnosis not present

## 2016-12-30 DIAGNOSIS — E669 Obesity, unspecified: Secondary | ICD-10-CM | POA: Diagnosis not present

## 2016-12-30 DIAGNOSIS — B078 Other viral warts: Secondary | ICD-10-CM | POA: Insufficient documentation

## 2016-12-30 DIAGNOSIS — Z7901 Long term (current) use of anticoagulants: Secondary | ICD-10-CM | POA: Insufficient documentation

## 2016-12-30 DIAGNOSIS — Z6833 Body mass index (BMI) 33.0-33.9, adult: Secondary | ICD-10-CM

## 2016-12-30 DIAGNOSIS — M25672 Stiffness of left ankle, not elsewhere classified: Secondary | ICD-10-CM | POA: Diagnosis not present

## 2016-12-30 DIAGNOSIS — I82412 Acute embolism and thrombosis of left femoral vein: Secondary | ICD-10-CM

## 2016-12-30 DIAGNOSIS — R262 Difficulty in walking, not elsewhere classified: Secondary | ICD-10-CM | POA: Diagnosis not present

## 2016-12-30 MED ORDER — RIVAROXABAN 20 MG PO TABS: 20.0000 mg | ORAL_TABLET | Freq: Every day | ORAL | 3 refills | Status: DC

## 2016-12-30 NOTE — Progress Notes (Signed)
Patient: Frank Marsh, Male    DOB: October 29, 1975, 41 y.o.   MRN: [id number] Visit Date: 12/31/2016  Today's Provider: Lavon Paganini, MD   Chief Complaint  Patient presents with  . Annual Exam   Subjective:    Annual physical exam Jaiyden Laur is a 41 y.o. male who presents today for health maintenance and complete physical. He feels well. He reports he is exercising due to his foot injury (PT and doing upper body). He reports he is sleeping well, better now that he can sleep without his boot.  ----------------------------------------------------------------- Headaches: intermittent, not migrainous, no Nausea, vomiting, vision changes, photophobia, phonophobia, weakness, numbness. Taking Excedrin Migraine as needed   GERD: Occasional tums, diet-driven, avoiding spicy foods, etc makes it better.  No regular medications  DVT: on Xarelto - started 11/23/16.  Denies any bleeding, melena, hematemesis.  Leg pain is better and swelling is decreased.  Much more mobile now.  MGM had DVT after hip surgery, otherwise no fam hx of DVT.  Review of Systems  Constitutional: Negative.   HENT: Negative.   Eyes: Negative.   Respiratory: Negative.   Gastrointestinal: Negative.   Endocrine: Negative.   Genitourinary: Positive for enuresis.  Musculoskeletal: Negative.   Skin: Negative.   Allergic/Immunologic: Negative.   Neurological: Negative.   Hematological: Negative.   Psychiatric/Behavioral: Negative.     Social History      He  reports that he quit smoking about 7 years ago. His smoking use included Cigarettes. He has a 15.00 pack-year smoking history. He has never used smokeless tobacco. He reports that he does not drink alcohol or use drugs.       Social History   Social History  . Marital status: Single    Spouse name: N/A  . Number of children: 2  . Years of education: N/A   Social History Main Topics  . Smoking status: Former Smoker    Packs/day: 1.00    Years: 15.00    Types: Cigarettes    Quit date: 11/01/2009  . Smokeless tobacco: Never Used  . Alcohol use No  . Drug use: No  . Sexual activity: Yes    Partners: Female   Other Topics Concern  . None   Social History Narrative  . None    Past Medical History:  Diagnosis Date  . DVT (deep venous thrombosis) ([place])   . GERD (gastroesophageal reflux disease)    OCC-NO MEDS  . Headache    OCC     Patient Active Problem List   Diagnosis Date Noted  . Palmar wart 12/30/2016  . Obesity 12/30/2016  . Anticoagulated 12/30/2016  . DVT (deep venous thrombosis) ([place]) 11/26/2016  . Rupture of Achilles tendon 10/30/2016  . Traumatic rupture of Achilles tendon 10/30/2016    Past Surgical History:  Procedure Laterality Date  . ACHILLES TENDON SURGERY Left 11/04/2016   Procedure: ACHILLES TENDON REPAIR;  Surgeon: Earnestine Leys, MD;  Location: [facility] ORS;  Service: Orthopedics;  Laterality: Left;  . INGUINAL HERNIA REPAIR Bilateral 1980  . LIPOMA EXCISION Right 2014   LEG, benign    Family History        Family Status  Relation Status  . Mother Alive  . Father Deceased at age 84  . Sister Alive  . Brother Alive  . MGM Alive  . PGF Deceased  . Brother Alive  . MGF Deceased  . PGM Deceased  . Mat Aunt Deceased  . Mat Uncle Deceased  His family history includes Breast cancer in his maternal grandmother; Colon cancer (age of onset: 32) in his maternal aunt; Healthy in his brother, brother, mother, and sister; Heart disease (age of onset: 1) in his maternal grandfather and paternal grandmother; Hypertension in his maternal grandmother; Mesothelioma in his father; Prostate cancer (age of onset: 56) in his paternal grandfather; Renal Disease in his maternal grandmother; Throat cancer in his maternal uncle.     No Known Allergies   Current Outpatient Prescriptions:  .  rivaroxaban (XARELTO) 20 MG TABS tablet, Take 1 tablet (20 mg total) by mouth daily with supper., Disp: 30 tablet, Rfl:  3 .  acetaminophen (TYLENOL) 325 MG tablet, Take 650 mg by mouth every 4 (four) hours as needed., Disp: , Rfl:    Patient Care Team: Virginia Crews, MD as PCP - General (Family Medicine)      Objective:   Vitals: BP 122/68 (BP Location: Left Arm, Patient Position: Sitting, Cuff Size: Large)   Pulse 62   Temp 98.1 F (36.7 C) (Oral)   Resp 16   Ht 5\' 9"  (1.753 m)   Wt 228 lb (103.4 kg)   BMI 33.67 kg/m    Vitals:   12/30/16 1524  BP: 122/68  Pulse: 62  Resp: 16  Temp: 98.1 F (36.7 C)  TempSrc: Oral  Weight: 228 lb (103.4 kg)  Height: 5\' 9"  (1.753 m)     Physical Exam  Constitutional: He is oriented to person, place, and time. He appears well-developed and well-nourished. No distress.  HENT:  Head: Normocephalic and atraumatic.  Right Ear: External ear normal.  Left Ear: External ear normal.  Nose: Nose normal.  Mouth/Throat: Oropharynx is clear and moist. No oropharyngeal exudate.  Eyes: Pupils are equal, round, and reactive to light. Conjunctivae are normal. No scleral icterus.  Neck: Neck supple. No thyromegaly present.  Cardiovascular: Normal rate, regular rhythm, normal heart sounds and intact distal pulses.   No murmur heard. Pulmonary/Chest: Effort normal and breath sounds normal. No respiratory distress. He has no wheezes. He has no rales.  Abdominal: Soft. Bowel sounds are normal. He exhibits no distension. There is no tenderness. There is no rebound and no guarding.  Musculoskeletal: He exhibits no edema or deformity.  CAM walker boot on L foot  Lymphadenopathy:    He has no cervical adenopathy.  Neurological: He is alert and oriented to person, place, and time.  Skin: Skin is warm and dry. No rash noted.  Palmar wart on L palm  Psychiatric: He has a normal mood and affect. His behavior is normal.  Vitals reviewed.    Depression Screen PHQ 2/9 Scores 11/26/2016  PHQ - 2 Score 2  PHQ- 9 Score 7     Assessment & Plan:     Routine Health  Maintenance and Physical Exam  Exercise Activities and Dietary recommendations Goals    None      Immunization History  Administered Date(s) Administered  . Influenza,inj,Quad PF,6+ Mos 12/30/2016  . Tdap 12/30/2016    Health Maintenance  Topic Date Due  . HIV Screening  06/28/1992  . TETANUS/TDAP  12/31/2026  . INFLUENZA VACCINE  Completed     Discussed health benefits of physical activity, and encouraged him to engage in regular exercise appropriate for his age and condition.  - will obtain records from recent HIV screening done at work - TDAP and flu shots given today    Problem List Items Addressed This Visit  Cardiovascular and Mediastinum   DVT (deep venous thrombosis) (H[facility])    Doing well Pain and swelling improving Continue Xarelto at 20 mg daily, refill given Plan for 6 months course, so and they will be in 05/2017 Follow-up in 6-8 weeks      Relevant Medications   rivaroxaban (XARELTO) 20 MG TABS tablet     Musculoskeletal and Integument   Palmar wart    Noted on exam today Has been present for several years Treat with OTC salicylic acid and occlusive dressings every night Follow-up as needed and consider freezing        Other   Obesity    Discussed healthy weight and BMI Discussed diet and exercise Continue to monitor Screening lipid panel, CMP      Relevant Orders   Lipid panel   Comprehensive metabolic panel   Anticoagulated    Continue Xarelto as above Check CBC Patient denies any bleeding      Relevant Orders   CBC w/Diff/Platelet    Other Visit Diagnoses    Healthcare maintenance    -  Primary   Need for Tdap vaccination       Relevant Orders   Tdap vaccine greater than or equal to 7yo IM (Completed)   Need for influenza vaccination       Relevant Orders   Flu Vaccine QUAD 36+ mos IM (Completed)      -------------------------------------------------------------------- Return in about 3 months (around 04/01/2017) for  DVT f/u.   The entirety of the information documented in the History of Present Illness, Review of Systems and Physical Exam were personally obtained by me. Portions of this information were initially documented by [place], [place] and reviewed by me for thoroughness and accuracy.    Lavon Paganini, MD  [facility]

## 2016-12-30 NOTE — Patient Instructions (Signed)
Plate method for nutrition  Salicylic acid for wart on L palm   Preventive Care 39-39 Years, Male Preventive care refers to lifestyle choices and visits with your health care provider that can promote health and wellness. What does preventive care include?  A yearly physical exam. This is also called an annual well check.  Dental exams once or twice a year.  Routine eye exams. Ask your health care provider how often you should have your eyes checked.  Personal lifestyle choices, including: ? Daily care of your teeth and gums. ? Regular physical activity. ? Eating a healthy diet. ? Avoiding tobacco and drug use. ? Limiting alcohol use. ? Practicing safe sex. What happens during an annual well check? The services and screenings done by your health care provider during your annual well check will depend on your age, overall health, lifestyle risk factors, and family history of disease. Counseling Your health care provider may ask you questions about your:  Alcohol use.  Tobacco use.  Drug use.  Emotional well-being.  Home and relationship well-being.  Sexual activity.  Eating habits.  Work and work Statistician.  Screening You may have the following tests or measurements:  Height, weight, and BMI.  Blood pressure.  Lipid and cholesterol levels. These may be checked every 5 years starting at age 25.  Diabetes screening. This is done by checking your blood sugar (glucose) after you have not eaten for a while (fasting).  Skin check.  Hepatitis C blood test.  Hepatitis B blood test.  Sexually transmitted disease (STD) testing.  Discuss your test results, treatment options, and if necessary, the need for more tests with your health care provider. Vaccines Your health care provider may recommend certain vaccines, such as:  Influenza vaccine. This is recommended every year.  Tetanus, diphtheria, and acellular pertussis (Tdap, Td) vaccine. You may need a Td  booster every 10 years.  Varicella vaccine. You may need this if you have not been vaccinated.  HPV vaccine. If you are 17 or younger, you may need three doses over 6 months.  Measles, mumps, and rubella (MMR) vaccine. You may need at least one dose of MMR.You may also need a second dose.  Pneumococcal 13-valent conjugate (PCV13) vaccine. You may need this if you have certain conditions and have not been vaccinated.  Pneumococcal polysaccharide (PPSV23) vaccine. You may need one or two doses if you smoke cigarettes or if you have certain conditions.  Meningococcal vaccine. One dose is recommended if you are age 50-21 years and a first-year college student living in a residence hall, or if you have one of several medical conditions. You may also need additional booster doses.  Hepatitis A vaccine. You may need this if you have certain conditions or if you travel or work in places where you may be exposed to hepatitis A.  Hepatitis B vaccine. You may need this if you have certain conditions or if you travel or work in places where you may be exposed to hepatitis B.  Haemophilus influenzae type b (Hib) vaccine. You may need this if you have certain risk factors.  Talk to your health care provider about which screenings and vaccines you need and how often you need them. This information is not intended to replace advice given to you by your health care provider. Make sure you discuss any questions you have with your health care provider. Document Released: 04/30/2001 Document Revised: 11/22/2015 Document Reviewed: 01/03/2015 Elsevier Interactive Patient Education  2017 [employer].

## 2016-12-31 NOTE — Assessment & Plan Note (Signed)
Continue Xarelto as above Check CBC Patient denies any bleeding

## 2016-12-31 NOTE — Assessment & Plan Note (Signed)
Doing well Pain and swelling improving Continue Xarelto at 20 mg daily, refill given Plan for 6 months course, so and they will be in 05/2017 Follow-up in 6-8 weeks

## 2016-12-31 NOTE — Assessment & Plan Note (Signed)
Discussed healthy weight and BMI Discussed diet and exercise Continue to monitor Screening lipid panel, CMP

## 2016-12-31 NOTE — Assessment & Plan Note (Signed)
Noted on exam today Has been present for several years Treat with OTC salicylic acid and occlusive dressings every night Follow-up as needed and consider freezing

## 2017-01-07 DIAGNOSIS — R262 Difficulty in walking, not elsewhere classified: Secondary | ICD-10-CM | POA: Diagnosis not present

## 2017-01-07 DIAGNOSIS — M25672 Stiffness of left ankle, not elsewhere classified: Secondary | ICD-10-CM | POA: Diagnosis not present

## 2017-01-09 DIAGNOSIS — S86012A Strain of left Achilles tendon, initial encounter: Secondary | ICD-10-CM | POA: Diagnosis not present

## 2017-01-09 DIAGNOSIS — M25672 Stiffness of left ankle, not elsewhere classified: Secondary | ICD-10-CM | POA: Diagnosis not present

## 2017-01-09 DIAGNOSIS — R262 Difficulty in walking, not elsewhere classified: Secondary | ICD-10-CM | POA: Diagnosis not present

## 2017-01-14 DIAGNOSIS — M25672 Stiffness of left ankle, not elsewhere classified: Secondary | ICD-10-CM | POA: Diagnosis not present

## 2017-01-14 DIAGNOSIS — R262 Difficulty in walking, not elsewhere classified: Secondary | ICD-10-CM | POA: Diagnosis not present

## 2017-01-16 DIAGNOSIS — R262 Difficulty in walking, not elsewhere classified: Secondary | ICD-10-CM | POA: Diagnosis not present

## 2017-01-16 DIAGNOSIS — M25672 Stiffness of left ankle, not elsewhere classified: Secondary | ICD-10-CM | POA: Diagnosis not present

## 2017-02-27 DIAGNOSIS — S86012A Strain of left Achilles tendon, initial encounter: Secondary | ICD-10-CM | POA: Diagnosis not present

## 2017-02-28 ENCOUNTER — Ambulatory Visit: Payer: Self-pay | Admitting: Family Medicine

## 2017-03-14 ENCOUNTER — Ambulatory Visit: Payer: BLUE CROSS/BLUE SHIELD | Admitting: Family Medicine

## 2017-03-14 NOTE — Progress Notes (Deleted)
       Patient: Frank Marsh Male    DOB: Sep 27, 1975   41 y.o.   MRN: [id number] Visit Date: 03/14/2017  Today's Provider: Lavon Paganini, MD   No chief complaint on file.  Subjective:    HPI     Follow up for DVT  The patient was last seen for this 2 months ago. Changes made at last visit include continuing Xarelto for a 6 month course, which will be completed in March of 2019.  He reports {excellent/good/fair/poor:19665} compliance with treatment. He feels that condition is {improved/worse/unchanged:3041574}. He {ACTION; IS/IS [id number]} having side effects. ***  ------------------------------------------------------------------------------------    No Known Allergies   Current Outpatient Medications:  .  acetaminophen (TYLENOL) 325 MG tablet, Take 650 mg by mouth every 4 (four) hours as needed., Disp: , Rfl:  .  rivaroxaban (XARELTO) 20 MG TABS tablet, Take 1 tablet (20 mg total) by mouth daily with supper., Disp: 30 tablet, Rfl: 3  Review of Systems  Social History   Tobacco Use  . Smoking status: Former Smoker    Packs/day: 1.00    Years: 15.00    Pack years: 15.00    Types: Cigarettes    Last attempt to quit: 11/01/2009    Years since quitting: 7.3  . Smokeless tobacco: Never Used  Substance Use Topics  . Alcohol use: No   Objective:   There were no vitals taken for this visit. There were no vitals filed for this visit.   Physical Exam      Assessment & Plan:           Lavon Paganini, MD  [facility]

## 2017-05-26 ENCOUNTER — Ambulatory Visit: Payer: BLUE CROSS/BLUE SHIELD | Admitting: Family Medicine

## 2017-05-26 ENCOUNTER — Encounter: Payer: Self-pay | Admitting: Family Medicine

## 2017-05-26 VITALS — BP 122/78 | HR 85 | Temp 98.1°F | Resp 16 | Wt 222.0 lb

## 2017-05-26 DIAGNOSIS — H1031 Unspecified acute conjunctivitis, right eye: Secondary | ICD-10-CM | POA: Diagnosis not present

## 2017-05-26 MED ORDER — OFLOXACIN 0.3 % OP SOLN: 2.0000 [drp] | Freq: Four times a day (QID) | OPHTHALMIC | 0 refills | Status: AC

## 2017-05-26 NOTE — Progress Notes (Signed)
Patient: Frank Marsh Male    DOB: Jul 20, 1975   42 y.o.   MRN: [id number] Visit Date: 05/26/2017  Today's Provider: Lavon Paganini, MD   I, Martha Clan, CMA, am acting as scribe for Lavon Paganini, MD.  Chief Complaint  Patient presents with  . Eye Problem   Subjective:    Eye Problem   The right eye is affected. This is a new problem. The current episode started yesterday. The problem has been unchanged. There was no injury mechanism. The patient is experiencing no pain. There is no known exposure to pink eye. He does not wear contacts. Associated symptoms include blurred vision, an eye discharge, eye redness, itching (and irritation) and a recent URI. Pertinent negatives include no double vision, fever, foreign body sensation, nausea, photophobia or vomiting. Treatments tried: Contact Solution. The treatment provided moderate (improved redness, but irritation is still present) relief.      No Known Allergies   Current Outpatient Medications:  .  acetaminophen (TYLENOL) 325 MG tablet, Take 650 mg by mouth every 4 (four) hours as needed., Disp: , Rfl:   Review of Systems  Constitutional: Negative for fever.  Eyes: Positive for blurred vision, discharge, redness and itching (and irritation). Negative for double vision and photophobia.  Gastrointestinal: Negative for nausea and vomiting.    Social History   Tobacco Use  . Smoking status: Former Smoker    Packs/day: 1.00    Years: 15.00    Pack years: 15.00    Types: Cigarettes    Last attempt to quit: 11/01/2009    Years since quitting: 7.5  . Smokeless tobacco: Never Used  Substance Use Topics  . Alcohol use: No   Objective:   BP 122/78 (BP Location: Left Arm, Patient Position: Sitting, Cuff Size: Large)   Pulse 85   Temp 98.1 F (36.7 C) (Oral)   Resp 16   Wt 222 lb (100.7 kg)   SpO2 98%   BMI 32.78 kg/m  Vitals:   05/26/17 1613  BP: 122/78  Pulse: 85  Resp: 16  Temp: 98.1 F (36.7 C)    TempSrc: Oral  SpO2: 98%  Weight: 222 lb (100.7 kg)     Physical Exam  Constitutional: He is oriented to person, place, and time. He appears well-developed and well-nourished. No distress.  HENT:  Head: Normocephalic and atraumatic.  Right Ear: Tympanic membrane, external ear and ear canal normal.  Left Ear: Tympanic membrane, external ear and ear canal normal.  Nose: Nose normal.  Mouth/Throat: Oropharynx is clear and moist. No oropharyngeal exudate.  Eyes: EOM are normal. Pupils are equal, round, and reactive to light. Right eye exhibits discharge. Left eye exhibits no discharge. Right conjunctiva is injected. Left conjunctiva is not injected.  Cardiovascular: Normal rate, regular rhythm, normal heart sounds and intact distal pulses.  No murmur heard. Pulmonary/Chest: Effort normal and breath sounds normal. No respiratory distress. He has no wheezes.  Neurological: He is alert and oriented to person, place, and time.  Skin: Skin is warm and dry.  Psychiatric: He has a normal mood and affect. His behavior is normal.        Assessment & Plan:     1. Acute bacterial conjunctivitis of right eye - unilateral conjunctivitis without symptoms to suggest anterior uveitis/iritis - likely viral given h/o URI, but will treat empirically for bacterial conjunctivitis - patient does not wear contacts - 5d course of Ofloxacin eye drops   Meds ordered this encounter  Medications  . ofloxacin (OCUFLOX) 0.3 % ophthalmic solution    Sig: Place 2 drops into the right eye 4 (four) times daily for 5 days.    Dispense:  5 mL    Refill:  0     Return if symptoms worsen or fail to improve.   The entirety of the information documented in the History of Present Illness, Review of Systems and Physical Exam were personally obtained by me. Portions of this information were initially documented by Raquel Sarna Ratchford, CMA and reviewed by me for thoroughness and accuracy.    Virginia Crews, MD,  [facility] 05/26/2017 4:37 PM

## 2017-05-26 NOTE — Patient Instructions (Signed)

## 2018-07-27 DIAGNOSIS — R300 Dysuria: Secondary | ICD-10-CM | POA: Diagnosis not present

## 2018-07-27 DIAGNOSIS — Z113 Encounter for screening for infections with a predominantly sexual mode of transmission: Secondary | ICD-10-CM | POA: Diagnosis not present

## 2019-04-18 ENCOUNTER — Other Ambulatory Visit: Payer: Self-pay

## 2019-04-18 ENCOUNTER — Emergency Department
Admission: EM | Admit: 2019-04-18 | Discharge: 2019-04-18 | Disposition: A | Discharge status: Home / Self Care | Payer: BC Managed Care – PPO | Attending: Emergency Medicine | Admitting: Emergency Medicine

## 2019-04-18 ENCOUNTER — Encounter: Payer: Self-pay | Admitting: Emergency Medicine

## 2019-04-18 DIAGNOSIS — K047 Periapical abscess without sinus: Secondary | ICD-10-CM | POA: Diagnosis not present

## 2019-04-18 DIAGNOSIS — Z87891 Personal history of nicotine dependence: Secondary | ICD-10-CM | POA: Diagnosis not present

## 2019-04-18 DIAGNOSIS — S025XXA Fracture of tooth (traumatic), initial encounter for closed fracture: Secondary | ICD-10-CM | POA: Insufficient documentation

## 2019-04-18 DIAGNOSIS — X58XXXA Exposure to other specified factors, initial encounter: Secondary | ICD-10-CM | POA: Diagnosis not present

## 2019-04-18 DIAGNOSIS — Y999 Unspecified external cause status: Secondary | ICD-10-CM | POA: Insufficient documentation

## 2019-04-18 DIAGNOSIS — Y939 Activity, unspecified: Secondary | ICD-10-CM | POA: Insufficient documentation

## 2019-04-18 DIAGNOSIS — K0381 Cracked tooth: Secondary | ICD-10-CM | POA: Diagnosis not present

## 2019-04-18 DIAGNOSIS — Z79899 Other long term (current) drug therapy: Secondary | ICD-10-CM | POA: Diagnosis not present

## 2019-04-18 DIAGNOSIS — Y929 Unspecified place or not applicable: Secondary | ICD-10-CM | POA: Diagnosis not present

## 2019-04-18 DIAGNOSIS — K0889 Other specified disorders of teeth and supporting structures: Secondary | ICD-10-CM | POA: Diagnosis not present

## 2019-04-18 DIAGNOSIS — S025XXB Fracture of tooth (traumatic), initial encounter for open fracture: Secondary | ICD-10-CM

## 2019-04-18 MED ORDER — LIDOCAINE VISCOUS HCL 2 % MT SOLN: 10.0000 mL | OROMUCOSAL | 0 refills | Status: DC | PRN

## 2019-04-18 MED ORDER — AMOXICILLIN 500 MG PO CAPS: 500.0000 mg | ORAL_CAPSULE | Freq: Three times a day (TID) | ORAL | 0 refills | Status: DC

## 2019-04-18 MED ORDER — LIDOCAINE VISCOUS HCL 2 % MT SOLN
15.0000 mL | Freq: Once | OROMUCOSAL | Status: AC
  Administered 2019-04-18: 15 mL via OROMUCOSAL
  Filled 2019-04-18: qty 15

## 2019-04-18 MED ORDER — AMOXICILLIN 500 MG PO CAPS
500.0000 mg | ORAL_CAPSULE | Freq: Once | ORAL | Status: AC
  Administered 2019-04-18: 500 mg via ORAL
  Filled 2019-04-18: qty 1

## 2019-04-18 MED ORDER — KETOROLAC TROMETHAMINE 30 MG/ML IJ SOLN
30.0000 mg | Freq: Once | INTRAMUSCULAR | Status: AC
  Administered 2019-04-18: 30 mg via INTRAMUSCULAR
  Filled 2019-04-18: qty 1

## 2019-04-18 NOTE — ED Triage Notes (Signed)
Patient with complaint of left upper gum pain that started last night. Patient states that he took OTC medication last night and that it helped the pain but patient states that the pain has become worse today.

## 2019-04-18 NOTE — Discharge Instructions (Addendum)
OPTIONS FOR DENTAL FOLLOW UP CARE ° °[place] [facility] - Local Safety Net Dental Clinics °http://www.ncdhhs.gov/dph/oralhealth/services/safetynetclinics.htm °  °[facility] (336-562-3123) ° °[facility] [place] ([phone]) ° °[facility] ([phone] ext [phone]) ° °[facility] ([phone]) ° °[facility] ([phone]) °This clinic caters to the indigent population and is on a lottery system. °Location: °[facility], [address], [place] °Clinic Hours: °Wednesdays from 6pm - 9pm, patients seen by a lottery system. °For dates, call or go to [email or web address].[facility].edu/[facility]/patients/Dental-[facility] °Services: °Cleanings, fillings and simple extractions. °Payment Options: °DENTAL WORK IS FREE OF CHARGE. Bring proof of income or support. °Best way to get seen: °Arrive at 5:15 pm - this is a lottery, NOT first come/first serve, so arriving earlier will not increase your chances of being seen. °  °  °[facility] °[phone] °Select option 1 for emergencies °  °Location: °[facility], [address], [place] °Clinic Hours: °No walk-ins accepted - call the day before to schedule an appointment. °Check in times are 9:30 am and 1:30 pm. °Services: °Simple extractions, temporary fillings, pulpectomy/pulp debridement, uncomplicated abscess drainage. °Payment Options: °PAYMENT IS DUE AT THE TIME OF SERVICE.  Fee is usually $100-200, additional surgical procedures (e.g. abscess drainage) may be extra. °Cash, checks, Visa/MasterCard accepted.  Can file Medicaid if patient is covered for dental - patient should call case worker to check. °No discount for [facility] patients. °Best way to get seen: °MUST call the day before and get onto the schedule. Can usually be seen the next 1-2 days. No walk-ins accepted. °  °  °[facility] °[phone] °   °Location: °[facility], [address], [place] °Clinic Hours: °M, W, Th, F 8am or 1:30pm, Tues 9a or 1:30 - first come/first served. °Services: °Simple extractions, temporary fillings, uncomplicated abscess drainage.  You do not need to be an [place] resident. °Payment Options: °PAYMENT IS DUE AT THE TIME OF SERVICE. °Dental insurance, otherwise sliding scale - bring proof of income or support. °Depending on income and treatment needed, cost is usually $50-200. °Best way to get seen: °Arrive early as it is first come/first served. °  °  °[facility] °[phone] °  °Location: °[address] °Clinic Hours: °Mon-Thu 8a-5p °Services: °Most basic dental services including extractions and fillings. °Payment Options: °PAYMENT IS DUE AT THE TIME OF SERVICE. °Sliding scale, up to 50% off - bring proof if income or support. °Medicaid with dental option accepted. °Best way to get seen: °Call to schedule an appointment, can usually be seen within 2 weeks OR they will try to see walk-ins - show up at 8a or 2p (you may have to wait). °  °  °[facility] °[phone] °[place] RESIDENTS ONLY °  °Location: °[facility], [address], [place], [place] [address] °Clinic Hours: By appointment only. °Monday - Thursday 8am-5pm, Friday 8am-12pm °Services: Cleanings, fillings, extractions. °Payment Options: °PAYMENT IS DUE AT THE TIME OF SERVICE. °Cash, Visa or MasterCard. Sliding scale - $30 minimum per service. °Best way to get seen: °Come in to office, complete packet and make an appointment - need proof of income °or support monies for each household member and proof of [place] residence. °Usually takes about a month to get in. °  °  °[facility] °[phone] °  °Location: °[address]   [place] °Clinic Hours: Walk-in Urgent Care Dental Services are offered Monday-Friday  mornings only. °The numbers of emergencies accepted daily is limited to the number of °providers available. °Maximum 15 - Mondays, Wednesdays & Thursdays °Maximum 10 - Tuesdays & Fridays °Services: °You do not need to be a [place] resident to be seen for a dental emergency. °Emergencies are defined as pain, swelling, abnormal bleeding, or dental trauma. Walkins will receive x-rays if needed. °NOTE: Dental cleaning is not an emergency. °Payment Options: °PAYMENT IS DUE AT THE TIME OF SERVICE. °Minimum co-pay is $40.00 for uninsured patients. °Minimum co-pay is $3.00 for Medicaid with dental coverage. °Dental Insurance is accepted and must be presented at time of visit. °Medicare does not cover dental. °Forms of payment: Cash, credit card, checks. °Best way to get seen: °If not previously registered with the clinic, walk-in dental registration begins at 7:15 am and is on a first come/first serve basis. °If previously registered with the clinic, call to make an appointment. °  °  °[facility] °[phone] °[place] RESIDENTS ONLY °  °Location: °[address], [place], [place] °Clinic Hours: °Mon-Thu 10a-2p °Services: Extractions only! °Payment Options: °FREE (donations accepted) - bring proof of income or support °Best way to get seen: °Call and schedule an appointment OR come at 8am on the 1st Monday of every month (except for holidays) when it is first come/first served. °  °  °[facility] °[phone] °  °Location: °[address],  °Clinic Hours: °Friday mornings °Services, Payment Options, Best way to get seen: °Call for info °

## 2019-04-18 NOTE — ED Provider Notes (Signed)
[facility] Emergency Department Provider Note  ____________________________________________  Time seen: Approximately 8:10 PM  I have reviewed the triage vital signs and the nursing notes.   HISTORY  Chief Complaint Dental Pain    HPI Frank Marsh is a 44 y.o. male that presents to the emergency department for evaluation of left upper dental and gum pain for 2 days.  Patient states that area feels swollen.  He has a broken tooth here and is unsure if it is infected. No drainage. No fevers. He sees Archivist.   Past Medical History:  Diagnosis Date  . DVT (deep venous thrombosis) ([place])   . GERD (gastroesophageal reflux disease)    OCC-NO MEDS  . Headache    OCC    Patient Active Problem List   Diagnosis Date Noted  . Palmar wart 12/30/2016  . Obesity 12/30/2016  . Anticoagulated 12/30/2016  . DVT (deep venous thrombosis) ([place]) 11/26/2016  . Rupture of Achilles tendon 10/30/2016  . Traumatic rupture of Achilles tendon 10/30/2016    Past Surgical History:  Procedure Laterality Date  . ACHILLES TENDON SURGERY Left 11/04/2016   Procedure: ACHILLES TENDON REPAIR;  Surgeon: Earnestine Leys, MD;  Location: [facility] ORS;  Service: Orthopedics;  Laterality: Left;  . INGUINAL HERNIA REPAIR Bilateral 1980  . LIPOMA EXCISION Right 2014   LEG, benign    Prior to Admission medications   Medication Sig Start Date End Date Taking? Authorizing Provider  acetaminophen (TYLENOL) 325 MG tablet Take 650 mg by mouth every 4 (four) hours as needed.    [provider]  amoxicillin (AMOXIL) 500 MG capsule Take 1 capsule (500 mg total) by mouth 3 (three) times daily. 04/18/19   Laban Emperor, PA-C  lidocaine (XYLOCAINE) 2 % solution Use as directed 10 mLs in the mouth or throat as needed. 04/18/19   Laban Emperor, PA-C    Allergies Patient has no known allergies.  Family History  Problem Relation Age of Onset  . Healthy Mother   . Mesothelioma  Father        smoker, asbestos exposure  . Healthy Sister   . Healthy Brother   . Hypertension Maternal Grandmother   . Renal Disease Maternal Grandmother        on HD  . Breast cancer Maternal Grandmother        s/p masectomy  . Prostate cancer Paternal Grandfather 36  . Healthy Brother   . Heart disease Maternal Grandfather 70  . Heart disease Paternal Grandmother 71  . Colon cancer Maternal Aunt 85  . Throat cancer Maternal Uncle        smoker    Social History Social History   Tobacco Use  . Smoking status: Former Smoker    Packs/day: 1.00    Years: 15.00    Pack years: 15.00    Types: Cigarettes    Quit date: 11/01/2009    Years since quitting: 9.4  . Smokeless tobacco: Never Used  Substance Use Topics  . Alcohol use: Yes  . Drug use: Yes    Types: Marijuana     Review of Systems  Constitutional: No fever/chills ENT: No upper respiratory complaints. Cardiovascular: No chest pain. Respiratory: No cough. No SOB. Gastrointestinal: No abdominal pain.  No nausea, no vomiting.  Musculoskeletal: Negative for musculoskeletal pain. Skin: Negative for rash, abrasions, lacerations, ecchymosis. Neurological: Negative for headaches   ____________________________________________   PHYSICAL EXAM:  VITAL SIGNS: ED Triage Vitals  Enc Vitals Group     BP  04/18/19 1923 (!) 145/89     Pulse Rate 04/18/19 1923 76     Resp 04/18/19 1923 18     Temp 04/18/19 1923 98.4 F (36.9 C)     Temp Source 04/18/19 1923 Oral     SpO2 04/18/19 1923 97 %     Weight 04/18/19 1922 220 lb (99.8 kg)     Height 04/18/19 1922 5\' 9"  (1.753 m)     Head Circumference --      Peak Flow --      Pain Score 04/18/19 1922 7     Pain Loc --      Pain Edu? --      Excl. in [place]? --      Constitutional: Alert and oriented. Well appearing and in no acute distress. Eyes: Conjunctivae are normal. PERRL. EOMI. Head: Atraumatic. ENT:      Ears:      Nose: No congestion/rhinnorhea. Top left  molar decayed to gumline with surrounding tenderness to palpation. No visible swelling. Full ROM of jaw.      Mouth/Throat: Mucous membranes are moist.  Neck: No stridor.  Cardiovascular: Normal rate, regular rhythm.  Good peripheral circulation. Respiratory: Normal respiratory effort without tachypnea or retractions. Lungs CTAB. Good air entry to the bases with no decreased or absent breath sounds. Musculoskeletal: Full range of motion to all extremities. No gross deformities appreciated. Neurologic:  Normal speech and language. No gross focal neurologic deficits are appreciated.  Skin:  Skin is warm, dry and intact. No rash noted. Psychiatric: Mood and affect are normal. Speech and behavior are normal. Patient exhibits appropriate insight and judgement.   ____________________________________________   LABS (all labs ordered are listed, but only abnormal results are displayed)  Labs Reviewed - No data to display ____________________________________________  EKG   ____________________________________________  RADIOLOGY  No results found.  ____________________________________________    PROCEDURES  Procedure(s) performed:    Procedures    Medications  ketorolac (TORADOL) 30 MG/ML injection 30 mg (30 mg Intramuscular Given 04/18/19 2029)  lidocaine (XYLOCAINE) 2 % viscous mouth solution 15 mL (15 mLs Mouth/Throat Given 04/18/19 2029)  amoxicillin (AMOXIL) capsule 500 mg (500 mg Oral Given 04/18/19 2113)     ____________________________________________   INITIAL IMPRESSION / ASSESSMENT AND PLAN / ED COURSE  Pertinent labs & imaging results that were available during my care of the patient were reviewed by me and considered in my medical decision making (see chart for details).  Review of the [place] CSRS was performed in accordance of the [place] prior to dispensing any controlled drugs.   Patient's diagnosis is consistent with dental infection. Patient will be discharged  home with prescriptions for amoxicillin and viscous lidocaine. Patient is to follow up with dentist as directed. Patient is given ED precautions to return to the ED for any worsening or new symptoms.   Frank Marsh was evaluated in Emergency Department on 04/18/2019 for the symptoms described in the history of present illness. He was evaluated in the context of the global COVID-19 pandemic, which necessitated consideration that the patient might be at risk for infection with the SARS-CoV-2 virus that causes COVID-19. Institutional protocols and algorithms that pertain to the evaluation of patients at risk for COVID-19 are in a state of rapid change based on information released by regulatory bodies including the CDC and federal and state organizations. These policies and algorithms were followed during the patient's care in the ED.   ____________________________________________  FINAL CLINICAL IMPRESSION(S) / ED DIAGNOSES  Final diagnoses:  Open fracture of tooth, initial encounter  Dental infection      NEW MEDICATIONS STARTED DURING THIS VISIT:  ED Discharge Orders         Ordered    amoxicillin (AMOXIL) 500 MG capsule  3 times daily     04/18/19 2044    lidocaine (XYLOCAINE) 2 % solution  As needed     04/18/19 2044              This chart was dictated using voice recognition software/Dragon. Despite best efforts to proofread, errors can occur which can change the meaning. Any change was purely unintentional.    Laban Emperor, PA-C 04/18/19 2210    Lavonia Drafts, MD 04/18/19 2221

## 2019-09-10 ENCOUNTER — Other Ambulatory Visit: Payer: Self-pay

## 2019-09-10 ENCOUNTER — Ambulatory Visit (INDEPENDENT_AMBULATORY_CARE_PROVIDER_SITE_OTHER): Payer: BC Managed Care – PPO | Admitting: Family Medicine

## 2019-09-10 ENCOUNTER — Encounter: Payer: Self-pay | Admitting: Family Medicine

## 2019-09-10 VITALS — BP 106/66 | HR 69 | Temp 97.1°F | Resp 16 | Ht 69.0 in | Wt 214.8 lb

## 2019-09-10 DIAGNOSIS — B078 Other viral warts: Secondary | ICD-10-CM | POA: Diagnosis not present

## 2019-09-10 NOTE — Assessment & Plan Note (Signed)
Change in size noted today Referred to Dermatology May use OTC salicylic acid while waiting on appointment

## 2019-09-10 NOTE — Patient Instructions (Signed)
Warts    Warts are small growths on the skin. They are common, and they are caused by a virus. Warts can be found on many parts of the body. A person may have one wart or many warts. Most warts will go away on their own with time, but this could take many months to a few years. Treatments may be done if needed.  What are the causes?  Warts are caused by a type of virus that is called HPV.  · This virus can spread from person to person through touching.  · Warts can also spread to other parts of the body when a person scratches a wart and then scratches normal skin.  What increases the risk?  You are more likely to get warts if:  · You are 10-20 years old.  · You have a weak body defense system (immune system).  · You are Caucasian.  What are the signs or symptoms?  The main symptom of this condition is small growths on the skin. Warts may:  · Be round, oval, or have an uneven shape.  · Feel rough to the touch.  · Be the color of your skin or light yellow, brown, or gray.  · Often be less than ½ inch (1.3 cm) in size.  · Go away and then come back again.  Most warts do not hurt, but some can hurt if they are large or if they are on the bottom of your feet.  How is this diagnosed?  A wart can often be diagnosed by how it looks. In some cases, the doctor might remove a little bit of the wart to test it (biopsy).  How is this treated?  Most of the time, warts do not need treatment. Sometimes people want warts removed. If treatment is needed or wanted, options may include:  · Putting creams or patches with medicine in them on the wart.  · Putting duct tape over the top of the wart.  · Freezing the wart.  · Burning the wart with:  ? A laser.  ? An electric probe.  · Giving a shot of medicine into the wart to help the body's defense system fight off the wart.  · Surgery to remove the wart.  Follow these instructions at home:    Medicines  · Apply over-the-counter and prescription medicines only as told by your  doctor.  · Do not apply over-the-counter wart medicines to your face or genitals before you ask your doctor if it is okay to do that.  Lifestyle  · Keep your body's defense system healthy. To do this:  ? Eat a healthy diet.  ? Get enough sleep.  ? Do not use any products that contain nicotine or tobacco, such as cigarettes and e-cigarettes. If you need help quitting, ask your doctor.  General instructions  · Wash your hands after you touch a wart.  · Do not scratch or pick at a wart.  · Avoid shaving hair that is over a wart.  · Keep all follow-up visits as told by your doctor. This is important.  Contact a doctor if:  · Your warts do not get better after treatment.  · You have redness, swelling, or pain at the site of a wart.  · You have bleeding from a wart, and the bleeding does not stop when you put light pressure on the wart.  · You have diabetes and you get a wart.  Summary  · Warts are small   your hands after you touch a wart. °· Keep all follow-up visits as told by your doctor. This is important. °This information is not intended to replace advice given to you by your health care provider. Make sure you discuss any questions you have with your health care provider. °Document Revised: 07/22/2017 Document Reviewed: 07/22/2017 °Elsevier Patient Education © 2020 [employer] ° °

## 2019-09-10 NOTE — Progress Notes (Signed)
Established patient visit   Patient: Frank Marsh   DOB: Dec 19, 1975   44 y.o. Male  MRN: [id number] Visit Date: 09/10/2019  I,Sulibeya S Dimas,acting as a scribe for Lavon Paganini, MD.,have documented all relevant documentation on the behalf of Lavon Paganini, MD,as directed by  Lavon Paganini, MD while in the presence of Lavon Paganini, MD.  Today's healthcare provider: Lavon Paganini, MD   Chief Complaint  Patient presents with   Warts   Subjective    HPI Patient here today C/O wart on palm of left hand. Patient reports pain, and growing in size. Patient reports he has used OTC and prescribed medications, reports no changes.  Patient Active Problem List   Diagnosis Date Noted   Palmar wart 12/30/2016   Obesity 12/30/2016   Anticoagulated 12/30/2016   DVT (deep venous thrombosis) (HCC) 11/26/2016   Rupture of Achilles tendon 10/30/2016   Traumatic rupture of Achilles tendon 10/30/2016   Social History   Tobacco Use   Smoking status: Former Smoker    Packs/day: 1.00    Years: 15.00    Pack years: 15.00    Types: Cigarettes    Quit date: 11/01/2009    Years since quitting: 9.8   Smokeless tobacco: Never Used  Vaping Use   Vaping Use: Some days  Substance Use Topics   Alcohol use: Yes   Drug use: Yes    Types: Marijuana       Medications: Outpatient Medications Prior to Visit  Medication Sig   acetaminophen (TYLENOL) 325 MG tablet Take 650 mg by mouth every 4 (four) hours as needed.   [DISCONTINUED] amoxicillin (AMOXIL) 500 MG capsule Take 1 capsule (500 mg total) by mouth 3 (three) times daily.   [DISCONTINUED] lidocaine (XYLOCAINE) 2 % solution Use as directed 10 mLs in the mouth or throat as needed. (Patient not taking: Reported on 09/10/2019)   No facility-administered medications prior to visit.    Review of Systems    Objective    BP 106/66 (BP Location: Left Arm, Patient Position: Sitting, Cuff Size: Large)     Pulse 69    Temp (!) 97.1 F (36.2 C) (Temporal)    Resp 16    Ht 5\' 9"  (1.753 m)    Wt 214 lb 12.8 oz (97.4 kg)    BMI 31.72 kg/m  BP Readings from Last 3 Encounters:  09/10/19 106/66  04/18/19 (!) 145/89  05/26/17 122/78   Wt Readings from Last 3 Encounters:  09/10/19 214 lb 12.8 oz (97.4 kg)  04/18/19 220 lb (99.8 kg)  05/26/17 222 lb (100.7 kg)      Physical Exam Vitals reviewed.  Skin:    General: Skin is warm and dry.     Findings: Lesion present.     Comments: Wart on palm of left hand Lipoma on left forearm  Lesion on palm of right hand  Neurological:     General: No focal deficit present.     Mental Status: He is alert and oriented to person, place, and time. Mental status is at baseline.  Psychiatric:        Mood and Affect: Mood normal.        Behavior: Behavior normal.     No results found for any visits on 09/10/19.  Assessment & Plan     Problem List Items Addressed This Visit      Musculoskeletal and Integument   Palmar wart - Primary    Change in size noted today  Referred to Dermatology May use OTC salicylic acid while waiting on appointment       Relevant Orders   Ambulatory referral to Dermatology       Return for CPE.      I, Lavon Paganini, MD, have reviewed all documentation for this visit. The documentation on 09/10/19 for the exam, diagnosis, procedures, and orders are all accurate and complete.   Larwence Tu, Dionne Bucy, MD, MPH [facility]

## 2019-10-22 ENCOUNTER — Ambulatory Visit: Payer: Self-pay | Admitting: Family Medicine

## 2019-12-03 ENCOUNTER — Ambulatory Visit: Payer: Self-pay | Admitting: Family Medicine

## 2019-12-27 ENCOUNTER — Encounter: Payer: Self-pay | Admitting: Dermatology

## 2019-12-27 ENCOUNTER — Ambulatory Visit: Payer: BC Managed Care – PPO | Admitting: Dermatology

## 2019-12-27 ENCOUNTER — Other Ambulatory Visit: Payer: Self-pay

## 2019-12-27 DIAGNOSIS — M720 Palmar fascial fibromatosis [Dupuytren]: Secondary | ICD-10-CM | POA: Diagnosis not present

## 2019-12-27 DIAGNOSIS — D485 Neoplasm of uncertain behavior of skin: Secondary | ICD-10-CM

## 2019-12-27 DIAGNOSIS — B079 Viral wart, unspecified: Secondary | ICD-10-CM | POA: Diagnosis not present

## 2019-12-27 DIAGNOSIS — L820 Inflamed seborrheic keratosis: Secondary | ICD-10-CM | POA: Diagnosis not present

## 2019-12-27 NOTE — Progress Notes (Signed)
New Patient Visit  Subjective  Frank Marsh is a 44 y.o. male who presents for the following: Lesions (B/L hand - concerned he may have a wart on the L palm; has been present for 3 or 4 years, is growing larger, and is painful at times. He does file it down but it grows back each time. He is concerned the lesion on his right hand may be an early wart and he would like it checked as well). Lesion R upper eyelid - irritated at times. Patient would like it removed.   The following portions of the chart were reviewed this encounter and updated as appropriate:  Tobacco  Allergies  Meds  Problems  Med Hx  Surg Hx  Fam Hx     Review of Systems:  No other skin or systemic complaints except as noted in HPI or Assessment and Plan.  Objective  Well appearing patient in no apparent distress; mood and affect are within normal limits.  A focused examination was performed including face, neck, chest and back and the hands. Relevant physical exam findings are noted in the Assessment and Plan.  Objective  R palm: R palm prox to the ring finger/4th finger - tendon/bursa swelling  Images    Objective  L palm: Verrucous papule -- Discussed viral etiology and contagion. 1.2 x 1.0 cm (L palm).      Objective  R upper lat eyelid: 0.6 cm brown papule   Assessment & Plan  Dupuytren's contracture R palm Pending today's biopsy of the L palm, may refer to orthopedic surgeon.   Neoplasm of uncertain behavior of skin (2) -hyperkeratosis in site of Dupuytren's contracture versus viral wart versus prurigo nodule L palm  Epidermal / dermal shaving  Lesion diameter (cm):  1.2 Informed consent: discussed and consent obtained   Timeout: patient name, date of birth, surgical site, and procedure verified   Patient was prepped and draped in usual sterile fashion: area prepped with alcohol. Anesthesia: the lesion was anesthetized in a standard fashion   Anesthetic:  1% lidocaine w/ epinephrine  1-100,000 buffered w/ 8.4% NaHCO3 Instrument used: flexible razor blade   Hemostasis achieved with: pressure, aluminum chloride and electrodesiccation   Outcome: patient tolerated procedure well   Post-procedure details: wound care instructions given   Post-procedure details comment:  Ointment and small bandage applied  Specimen 1 - Surgical pathology Differential Diagnosis: D48.5 r/o wart vs other  Check Margins: No Verrucous papule -- Discussed viral etiology and contagion. 1.2 x 1.0 cm (L palm).  R upper lat eyelid  Skin excision  Lesion length (cm):  0.6 Lesion width (cm):  0.6 Margin per side (cm):  0.2 Total excision diameter (cm):  1 Informed consent: discussed and consent obtained   Timeout: patient name, date of birth, surgical site, and procedure verified   Procedure prep:  Patient was prepped and draped in usual sterile fashion Prep type:  Isopropyl alcohol Anesthesia: the lesion was anesthetized in a standard fashion   Anesthetic:  1% lidocaine w/ epinephrine 1-100,000 buffered w/ 8.4% NaHCO3 Hemostasis achieved with: pressure   Hemostasis achieved with comment:  Electrocautery Outcome: patient tolerated procedure well with no complications   Post-procedure details: sterile dressing applied and wound care instructions given   Dressing type: petrolatum   Additional details:  Simple excision performed today   Specimen 2 - Surgical pathology Differential Diagnosis: D48.5 irritated nevus r/o dysplasia vs irritated seborrheic keratosis  Check Margins: No 0.6 cm brown papule  If L palm (+)  for Dupuytren's contracture recommend referral to orthopedic surgeon.   Return pending biopsy results.  Luther Redo, CMA, am acting as scribe for Sarina Ser, MD .  Documentation: I have reviewed the above documentation for accuracy and completeness, and I agree with the above.  Sarina Ser, MD

## 2019-12-27 NOTE — Patient Instructions (Signed)

## 2020-01-03 ENCOUNTER — Telehealth: Payer: Self-pay

## 2020-01-03 NOTE — Telephone Encounter (Signed)
Left message for patient to call office for results/hd 

## 2020-01-03 NOTE — Telephone Encounter (Signed)
-----   Message from Ralene Bathe, MD sent at 12/30/2019  5:52 PM EDT ----- 1. Skin , left palm COMPATIBLE WITH SURFACE OF VERRUCA VULGARIS, IRRITATED 2. Skin , right upper lat eyelid SEBORRHEIC KERATOSIS, IRRITATED  1- benign viral wart Recheck in 6 weeks to re-evaluate wart and determine if further treatment needed Please schedule pt for appt. 2- Benign irritated Keratosis of eyelid No further treatment needed.

## 2020-01-05 NOTE — Telephone Encounter (Signed)
Patient advised of BX results and scheduled for 6 week follow up.

## 2020-02-21 ENCOUNTER — Ambulatory Visit: Payer: BC Managed Care – PPO | Admitting: Dermatology

## 2021-03-15 ENCOUNTER — Emergency Department: Payer: BC Managed Care – PPO

## 2021-03-15 ENCOUNTER — Other Ambulatory Visit: Payer: Self-pay

## 2021-03-15 ENCOUNTER — Emergency Department
Admission: EM | Admit: 2021-03-15 | Discharge: 2021-03-15 | Disposition: A | Discharge status: Home / Self Care | Payer: BC Managed Care – PPO | Attending: Emergency Medicine | Admitting: Emergency Medicine

## 2021-03-15 DIAGNOSIS — R2231 Localized swelling, mass and lump, right upper limb: Secondary | ICD-10-CM | POA: Diagnosis not present

## 2021-03-15 DIAGNOSIS — S4291XA Fracture of right shoulder girdle, part unspecified, initial encounter for closed fracture: Secondary | ICD-10-CM | POA: Diagnosis not present

## 2021-03-15 DIAGNOSIS — R609 Edema, unspecified: Secondary | ICD-10-CM

## 2021-03-15 DIAGNOSIS — S42291A Other displaced fracture of upper end of right humerus, initial encounter for closed fracture: Secondary | ICD-10-CM | POA: Diagnosis not present

## 2021-03-15 DIAGNOSIS — M79621 Pain in right upper arm: Secondary | ICD-10-CM | POA: Insufficient documentation

## 2021-03-15 DIAGNOSIS — I82611 Acute embolism and thrombosis of superficial veins of right upper extremity: Secondary | ICD-10-CM | POA: Diagnosis not present

## 2021-03-15 DIAGNOSIS — M25521 Pain in right elbow: Secondary | ICD-10-CM | POA: Diagnosis not present

## 2021-03-15 DIAGNOSIS — Z5321 Procedure and treatment not carried out due to patient leaving prior to being seen by health care provider: Secondary | ICD-10-CM | POA: Insufficient documentation
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
## 2021-03-16 ENCOUNTER — Ambulatory Visit: Payer: Self-pay

## 2021-03-16 DIAGNOSIS — S42292A Other displaced fracture of upper end of left humerus, initial encounter for closed fracture: Secondary | ICD-10-CM

## 2021-03-16 MED ORDER — APIXABAN 5 MG PO TABS: ORAL_TABLET | ORAL | 0 refills | Status: DC

## 2021-03-16 NOTE — Telephone Encounter (Signed)
Have sent prescription for short course of eliquis. Keep arm elevated above level of shoulder as much as possible. Can apply warm compresses. Need to make a follow up here next with Dr. Jacinto Reap, Dr Ky Barban, or a PA.  Also is he following up with orthopedics for arm fracture? If not then needs a referral.

## 2021-03-16 NOTE — Telephone Encounter (Signed)
Patient advised as below. He does need a referral to ortho. Referral placed today. Patient aware to keep fu appt with Dr. Jacinto Reap 03/22/21.
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
## 2021-03-16 NOTE — Addendum Note (Signed)
Addended by: Shawna Orleans on: 03/16/2021 02:24 PM   Modules accepted: Orders
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
## 2021-03-22 ENCOUNTER — Ambulatory Visit: Payer: BC Managed Care – PPO | Admitting: Family Medicine

## 2021-03-22 ENCOUNTER — Encounter: Payer: Self-pay | Admitting: Family Medicine

## 2021-03-22 ENCOUNTER — Other Ambulatory Visit: Payer: Self-pay

## 2021-03-22 VITALS — BP 131/74 | HR 70 | Resp 16 | Ht 69.0 in | Wt 227.8 lb

## 2021-03-22 DIAGNOSIS — S42291D Other displaced fracture of upper end of right humerus, subsequent encounter for fracture with routine healing: Secondary | ICD-10-CM

## 2021-03-22 DIAGNOSIS — S6992XD Unspecified injury of left wrist, hand and finger(s), subsequent encounter: Secondary | ICD-10-CM | POA: Diagnosis not present

## 2021-03-22 DIAGNOSIS — E669 Obesity, unspecified: Secondary | ICD-10-CM

## 2021-03-22 DIAGNOSIS — I82890 Acute embolism and thrombosis of other specified veins: Secondary | ICD-10-CM

## 2021-03-22 DIAGNOSIS — S82831D Other fracture of upper and lower end of right fibula, subsequent encounter for closed fracture with routine healing: Secondary | ICD-10-CM

## 2021-03-22 DIAGNOSIS — Z6833 Body mass index (BMI) 33.0-33.9, adult: Secondary | ICD-10-CM

## 2021-03-23 LAB — CBC WITH DIFFERENTIAL/PLATELET
Basophils Absolute: 0.1 10*3/uL (ref 0.0–0.2)
Basos: 1 %
EOS (ABSOLUTE): 0.1 10*3/uL (ref 0.0–0.4)
Eos: 1 %
Hematocrit: 41.2 % (ref 37.5–51.0)
Hemoglobin: 14 g/dL (ref 13.0–17.7)
Immature Grans (Abs): 0 10*3/uL (ref 0.0–0.1)
Immature Granulocytes: 0 %
Lymphocytes Absolute: 2.5 10*3/uL (ref 0.7–3.1)
Lymphs: 30 %
MCH: 28.6 pg (ref 26.6–33.0)
MCHC: 34 g/dL (ref 31.5–35.7)
MCV: 84 fL (ref 79–97)
Monocytes Absolute: 0.6 10*3/uL (ref 0.1–0.9)
Monocytes: 7 %
Neutrophils Absolute: 5.1 10*3/uL (ref 1.4–7.0)
Neutrophils: 61 %
Platelets: 389 10*3/uL (ref 150–450)
RBC: 4.9 x10E6/uL (ref 4.14–5.80)
RDW: 13.1 % (ref 11.6–15.4)
WBC: 8.3 10*3/uL (ref 3.4–10.8)

## 2021-03-23 LAB — COMPREHENSIVE METABOLIC PANEL
ALT: 35 IU/L (ref 0–44)
AST: 24 IU/L (ref 0–40)
Albumin/Globulin Ratio: 1.9 (ref 1.2–2.2)
Albumin: 4.3 g/dL (ref 4.0–5.0)
Alkaline Phosphatase: 121 IU/L (ref 44–121)
BUN/Creatinine Ratio: 10 (ref 9–20)
BUN: 12 mg/dL (ref 6–24)
Bilirubin Total: 0.4 mg/dL (ref 0.0–1.2)
CO2: 25 mmol/L (ref 20–29)
Calcium: 10 mg/dL (ref 8.7–10.2)
Chloride: 103 mmol/L (ref 96–106)
Creatinine, Ser: 1.18 mg/dL (ref 0.76–1.27)
Globulin, Total: 2.3 g/dL (ref 1.5–4.5)
Glucose: 88 mg/dL (ref 70–99)
Potassium: 4.4 mmol/L (ref 3.5–5.2)
Sodium: 140 mmol/L (ref 134–144)
Total Protein: 6.6 g/dL (ref 6.0–8.5)
eGFR: 79 mL/min/{1.73_m2} (ref >59)

## 2021-03-23 LAB — LIPID PANEL
Chol/HDL Ratio: 5.4 ratio — ABNORMAL HIGH (ref 0.0–5.0)
Cholesterol, Total: 196 mg/dL (ref 100–199)
HDL: 36 mg/dL — ABNORMAL LOW (ref >39)
LDL Chol Calc (NIH): 145 mg/dL — ABNORMAL HIGH (ref 0–99)
Triglycerides: 83 mg/dL (ref 0–149)
VLDL Cholesterol Cal: 15 mg/dL (ref 5–40)

## 2021-03-30 DIAGNOSIS — M20012 Mallet finger of left finger(s): Secondary | ICD-10-CM | POA: Diagnosis not present

## 2021-03-30 DIAGNOSIS — S82424A Nondisplaced transverse fracture of shaft of right fibula, initial encounter for closed fracture: Secondary | ICD-10-CM | POA: Diagnosis not present

## 2021-03-30 DIAGNOSIS — S42251A Displaced fracture of greater tuberosity of right humerus, initial encounter for closed fracture: Secondary | ICD-10-CM | POA: Diagnosis not present

## 2021-04-20 DIAGNOSIS — S42251A Displaced fracture of greater tuberosity of right humerus, initial encounter for closed fracture: Secondary | ICD-10-CM | POA: Diagnosis not present

## 2021-04-20 DIAGNOSIS — S82424A Nondisplaced transverse fracture of shaft of right fibula, initial encounter for closed fracture: Secondary | ICD-10-CM | POA: Diagnosis not present

## 2021-05-28 DIAGNOSIS — S42251A Displaced fracture of greater tuberosity of right humerus, initial encounter for closed fracture: Secondary | ICD-10-CM | POA: Diagnosis not present

## 2021-06-08 DIAGNOSIS — M25611 Stiffness of right shoulder, not elsewhere classified: Secondary | ICD-10-CM | POA: Diagnosis not present

## 2021-06-08 DIAGNOSIS — M25511 Pain in right shoulder: Secondary | ICD-10-CM | POA: Diagnosis not present

## 2021-06-19 DIAGNOSIS — M25611 Stiffness of right shoulder, not elsewhere classified: Secondary | ICD-10-CM | POA: Diagnosis not present

## 2021-06-19 DIAGNOSIS — M25511 Pain in right shoulder: Secondary | ICD-10-CM | POA: Diagnosis not present

## 2021-06-25 ENCOUNTER — Ambulatory Visit: Payer: BC Managed Care – PPO | Admitting: Family Medicine

## 2021-06-25 NOTE — Progress Notes (Deleted)
? ? ?I,[place] S Manisha Cancel,acting as a scribe for Lavon Paganini, MD.,have documented all relevant documentation on the behalf of Lavon Paganini, MD,as directed by  Lavon Paganini, MD while in the presence of Lavon Paganini, MD. ? ? ?Complete physical exam ? ? ?Patient: Frank Marsh   DOB: 11/09/1975   46 y.o. Male  MRN: [id number] ?Visit Date: 06/25/2021 ? ?Today's healthcare provider: Lavon Paganini, MD  ? ?No chief complaint on file. ? ?Subjective  ?  ?Frank Marsh is a 46 y.o. male who presents today for a complete physical exam.  ?He reports consuming a {diet types:17450} diet. {Exercise:19826} He generally feels {well/fairly well/poorly:18703}. He reports sleeping {well/fairly well/poorly:18703}. He {does/does not:200015} have additional problems to discuss today.  ?HPI  ? ? ?Past Medical History:  ?Diagnosis Date  ? DVT (deep venous thrombosis) ([place])   ? GERD (gastroesophageal reflux disease)   ? OCC-NO MEDS  ? Headache   ? OCC  ? ?Past Surgical History:  ?Procedure Laterality Date  ? ACHILLES TENDON SURGERY Left 11/04/2016  ? Procedure: ACHILLES TENDON REPAIR;  Surgeon: Earnestine Leys, MD;  Location: [facility] ORS;  Service: Orthopedics;  Laterality: Left;  ? INGUINAL HERNIA REPAIR Bilateral 1980  ? LIPOMA EXCISION Right 2014  ? LEG, benign  ? ?Social History  ? ?Socioeconomic History  ? Marital status: Single  ?  Spouse name: Not on file  ? Number of children: 2  ? Years of education: Not on file  ? Highest education level: Not on file  ?Occupational History  ? Not on file  ?Tobacco Use  ? Smoking status: Former  ?  Packs/day: 1.00  ?  Years: 15.00  ?  Pack years: 15.00  ?  Types: Cigarettes  ?  Quit date: 11/01/2009  ?  Years since quitting: 11.6  ? Smokeless tobacco: Never  ?Vaping Use  ? Vaping Use: Some days  ?Substance and Sexual Activity  ? Alcohol use: Yes  ? Drug use: Yes  ?  Types: Marijuana  ? Sexual activity: Not on file  ?Other Topics Concern  ? Not on file  ?Social History Narrative  ? Not  on file  ? ?Social Determinants of Health  ? ?Financial Resource Strain: Not on file  ?Food Insecurity: Not on file  ?Transportation Needs: Not on file  ?Physical Activity: Not on file  ?Stress: Not on file  ?Social Connections: Not on file  ?Intimate Partner Violence: Not on file  ? ?Family Status  ?Relation Name Status  ? Mother  Alive  ? Father  Deceased at age 62  ? Sister  Alive  ? Brother  Alive  ? MGM  Alive  ? PGF  Deceased  ? Brother  Alive  ? MGF  Deceased  ? PGM  Deceased  ? Mat Aunt  Deceased  ? Mat Uncle  Deceased  ? ?Family History  ?Problem Relation Age of Onset  ? Healthy Mother   ? Mesothelioma Father   ?     smoker, asbestos exposure  ? Healthy Sister   ? Healthy Brother   ? Hypertension Maternal Grandmother   ? Renal Disease Maternal Grandmother   ?     on HD  ? Breast cancer Maternal Grandmother   ?     s/p masectomy  ? Prostate cancer Paternal Grandfather 32  ? Healthy Brother   ? Heart disease Maternal Grandfather 36  ? Heart disease Paternal Grandmother 20  ? Colon cancer Maternal Aunt 55  ? Throat cancer Maternal Uncle   ?  smoker  ? ?No Known Allergies  ?Patient Care Team: ?Virginia Crews, MD as PCP - General (Family Medicine)  ? ?Medications: ?Outpatient Medications Prior to Visit  ?Medication Sig  ? acetaminophen (TYLENOL) 325 MG tablet Take 650 mg by mouth every 4 (four) hours as needed.  ? apixaban (ELIQUIS) 5 MG TABS tablet Take 2 tablets (92m) twice daily for 7 days, then 1 tablet (515m twice daily  ? ?No facility-administered medications prior to visit.  ? ? ?Review of Systems  ?All other systems reviewed and are negative. ? ?Last CBC ?Lab Results  ?Component Value Date  ? WBC 8.3 03/22/2021  ? HGB 14.0 03/22/2021  ? HCT 41.2 03/22/2021  ? MCV 84 03/22/2021  ? MCH 28.6 03/22/2021  ? RDW 13.1 03/22/2021  ? PLT 389 03/22/2021  ? ?Last metabolic panel ?Lab Results  ?Component Value Date  ? GLUCOSE 88 03/22/2021  ? NA 140 03/22/2021  ? K 4.4 03/22/2021  ? CL 103 03/22/2021  ?  CO2 25 03/22/2021  ? BUN 12 03/22/2021  ? CREATININE 1.18 03/22/2021  ? EGFR 79 03/22/2021  ? CALCIUM 10.0 03/22/2021  ? PROT 6.6 03/22/2021  ? ALBUMIN 4.3 03/22/2021  ? LABGLOB 2.3 03/22/2021  ? AGRATIO 1.9 03/22/2021  ? BILITOT 0.4 03/22/2021  ? ALKPHOS 121 03/22/2021  ? AST 24 03/22/2021  ? ALT 35 03/22/2021  ? ?Last lipids ?Lab Results  ?Component Value Date  ? CHOL 196 03/22/2021  ? HDL 36 (L) 03/22/2021  ? LDLCALC 145 (H) 03/22/2021  ? TRIG 83 03/22/2021  ? CHOLHDL 5.4 (H) 03/22/2021  ? ?  ? Objective  ?  ?There were no vitals taken for this visit. ?BP Readings from Last 3 Encounters:  ?03/22/21 131/74  ?09/10/19 106/66  ?04/18/19 (!) 145/89  ? ?Wt Readings from Last 3 Encounters:  ?03/22/21 227 lb 12.8 oz (103.3 kg)  ?09/10/19 214 lb 12.8 oz (97.4 kg)  ?04/18/19 220 lb (99.8 kg)  ? ?  ? ? ?Physical Exam  ?*** ? ?Last depression screening scores ? ?  03/22/2021  ? 11:17 AM 09/10/2019  ? 11:07 AM 11/26/2016  ?  2:53 PM  ?PHQ 2/9 Scores  ?PHQ - 2 Score 0 0 2  ?PHQ- 9 Score   7  ? ?Last fall risk screening ? ?  09/10/2019  ? 11:07 AM  ?Fall Risk   ?Falls in the past year? 0  ?Number falls in past yr: 0  ?Injury with Fall? 0  ?Risk for fall due to : No Fall Risks  ?Follow up Falls evaluation completed  ? ?Last Audit-C alcohol use screening ?   ? View : No data to display.  ?  ?  ?  ? ?A score of 3 or more in women, and 4 or more in men indicates increased risk for alcohol abuse, EXCEPT if all of the points are from question 1  ? ?No results found for any visits on 06/25/21. ? Assessment & Plan  ?  ?Routine Health Maintenance and Physical Exam ? ?Exercise Activities and Dietary recommendations ? Goals   ?None ?  ? ? ?Immunization History  ?Administered Date(s) Administered  ? Influenza,inj,Quad PF,6+ Mos 12/30/2016  ? Tdap 12/30/2016  ? ? ?Health Maintenance  ?Topic Date Due  ? COVID-19 Vaccine (1) Never done  ? HIV Screening  Never done  ? Hepatitis C Screening  Never done  ? INFLUENZA VACCINE  10/16/2021  ?  TETANUS/TDAP  12/31/2026  ? HPV VACCINES  Aged  Out  ? ? ?Discussed health benefits of physical activity, and encouraged him to engage in regular exercise appropriate for his age and condition. ? ?*** ? ?No follow-ups on file.  ?  ? ?{provider attestation***:1} ? ? ?Lavon Paganini, MD  ?[facility] ?[phone] (phone) ?[phone] (fax) ? ?[facility] ?

## 2022-09-16 ENCOUNTER — Ambulatory Visit: Payer: BC Managed Care – PPO | Admitting: Family Medicine

## 2022-09-17 ENCOUNTER — Ambulatory Visit: Payer: BC Managed Care – PPO | Admitting: Family Medicine

## 2022-10-11 ENCOUNTER — Ambulatory Visit (INDEPENDENT_AMBULATORY_CARE_PROVIDER_SITE_OTHER): Payer: BC Managed Care – PPO | Admitting: Family Medicine

## 2022-10-11 ENCOUNTER — Encounter: Payer: Self-pay | Admitting: Family Medicine

## 2022-10-11 VITALS — BP 128/78 | HR 66 | Temp 98.2°F | Resp 12 | Ht 69.0 in | Wt 224.2 lb

## 2022-10-11 DIAGNOSIS — E669 Obesity, unspecified: Secondary | ICD-10-CM

## 2022-10-11 DIAGNOSIS — Z6833 Body mass index (BMI) 33.0-33.9, adult: Secondary | ICD-10-CM

## 2022-10-11 DIAGNOSIS — Z0000 Encounter for general adult medical examination without abnormal findings: Secondary | ICD-10-CM

## 2022-10-11 DIAGNOSIS — Z1211 Encounter for screening for malignant neoplasm of colon: Secondary | ICD-10-CM

## 2022-10-11 DIAGNOSIS — Z114 Encounter for screening for human immunodeficiency virus [HIV]: Secondary | ICD-10-CM | POA: Diagnosis not present

## 2022-10-11 DIAGNOSIS — Z13220 Encounter for screening for lipoid disorders: Secondary | ICD-10-CM

## 2022-10-11 DIAGNOSIS — Z1159 Encounter for screening for other viral diseases: Secondary | ICD-10-CM | POA: Diagnosis not present

## 2022-10-11 NOTE — Progress Notes (Signed)
Complete physical exam  Patient: Frank Marsh   DOB: 02-08-1976   47 y.o. Male  MRN: [id number]  Subjective:    Chief Complaint  Patient presents with   Annual Exam    Frank Marsh is a 47 y.o. male who presents today for a complete physical exam. He reports consuming a general diet.  He generally feels well. He reports sleeping well. He does not have additional problems to discuss today.   Discussed the use of AI scribe software for clinical note transcription with the patient, who gave verbal consent to proceed.  History of Present Illness   The patient, with a history of DVT, presents for an annual physical. They report feeling well and sleeping well. They deny any concerns or symptoms. They have discontinued Eliquis, a blood thinner, and have not had any issues since discontinuation (h/o DVT).  The patient recently turned 47 and is due for colon cancer screening. They are agreeable to a colonoscopy and a referral will be made to [facility].  The patient has not had labs in about a year and a half. They are also due for Hepatitis C and HIV screening, which are recommended once in a lifetime for all adults.  The patient has not seen a dentist in a while and is due for a dental visit.        Most recent fall risk assessment:    10/11/2022    8:12 AM  Fall Risk   Falls in the past year? 0  Number falls in past yr: 0  Injury with Fall? 0  Risk for fall due to : No Fall Risks  Follow up Falls evaluation completed     Most recent depression screenings:    10/11/2022    8:12 AM 03/22/2021   11:17 AM  PHQ 2/9 Scores  PHQ - 2 Score 0 0        Patient Care Team: Erasmo Downer, MD as PCP - General (Family Medicine)   Outpatient Medications Prior to Visit  Medication Sig   acetaminophen (TYLENOL) 325 MG tablet Take 650 mg by mouth every 4 (four) hours as needed.   [DISCONTINUED] apixaban (ELIQUIS) 5 MG TABS tablet Take 2 tablets (10mg ) twice daily for 7 days,  then 1 tablet (5mg ) twice daily   No facility-administered medications prior to visit.    ROS per hpi     Objective:     BP 128/78 (BP Location: Left Arm, Patient Position: Sitting, Cuff Size: Large)   Pulse 66   Temp 98.2 F (36.8 C) (Temporal)   Resp 12   Ht 5\' 9"  (1.753 m)   Wt 224 lb 3.2 oz (101.7 kg)   SpO2 97%   BMI 33.11 kg/m    Physical Exam Vitals reviewed.  Constitutional:      General: He is not in acute distress.    Appearance: Normal appearance. He is well-developed. He is not diaphoretic.  HENT:     Head: Normocephalic and atraumatic.     Right Ear: Tympanic membrane, ear canal and external ear normal.     Left Ear: Tympanic membrane, ear canal and external ear normal.     Nose: Nose normal.     Mouth/Throat:     Mouth: Mucous membranes are moist.     Pharynx: Oropharynx is clear. No oropharyngeal exudate.  Eyes:     General: No scleral icterus.    Conjunctiva/sclera: Conjunctivae normal.     Pupils: Pupils are equal, round, and  reactive to light.  Neck:     Thyroid: No thyromegaly.  Cardiovascular:     Rate and Rhythm: Normal rate and regular rhythm.     Heart sounds: Normal heart sounds. No murmur heard. Pulmonary:     Effort: Pulmonary effort is normal. No respiratory distress.     Breath sounds: Normal breath sounds. No wheezing or rales.  Abdominal:     General: There is no distension.     Palpations: Abdomen is soft.     Tenderness: There is no abdominal tenderness.  Musculoskeletal:        General: No deformity.     Cervical back: Neck supple.     Right lower leg: No edema.     Left lower leg: No edema.  Lymphadenopathy:     Cervical: No cervical adenopathy.  Skin:    General: Skin is warm and dry.     Findings: No rash.  Neurological:     Mental Status: He is alert and oriented to person, place, and time. Mental status is at baseline.     Gait: Gait normal.  Psychiatric:        Mood and Affect: Mood normal.        Behavior:  Behavior normal.        Thought Content: Thought content normal.      No results found for any visits on 10/11/22.     Assessment & Plan:    Routine Health Maintenance and Physical Exam  Immunization History  Administered Date(s) Administered   Influenza,inj,Quad PF,6+ Mos 12/30/2016   Tdap 12/30/2016    Health Maintenance  Topic Date Due   COVID-19 Vaccine (1) Never done   HIV Screening  Never done   Hepatitis C Screening  Never done   Colonoscopy  Never done   INFLUENZA VACCINE  10/17/2022   DTaP/Tdap/Td (2 - Td or Tdap) 12/31/2026   HPV VACCINES  Aged Out    Discussed health benefits of physical activity, and encouraged him to engage in regular exercise appropriate for his age and condition.  Problem List Items Addressed This Visit       Other   Obesity   Relevant Orders   Comprehensive metabolic panel   Lipid panel   Other Visit Diagnoses     Encounter for annual physical exam    -  Primary   Relevant Orders   HIV antibody (with reflex)   Hepatitis C Antibody   Comprehensive metabolic panel   Lipid panel   Colon cancer screening       Relevant Orders   Ambulatory referral to Gastroenterology   Need for hepatitis C screening test       Relevant Orders   Hepatitis C Antibody   Encounter for screening for HIV       Relevant Orders   HIV antibody (with reflex)   Screening for lipid disorders       Relevant Orders   Lipid panel          Colon Cancer Screening: Patient recently turned 47 and is due for initial colon cancer screening. Discussed the benefits of colonoscopy as the gold standard for screening. -Refer to [facility] for colonoscopy.  Influenza and COVID-19 Vaccination: Discussed upcoming availability of flu shot and COVID-19 vaccine booster in the fall.  Routine Lab Work: It has been approximately a year and a half since last labs. Plan to check kidney and liver function, blood sugar, and cholesterol. -Order labs for kidney and liver  function, blood sugar, and cholesterol.  Hepatitis C and HIV Screening: Recommended one-time screening for all adults. Patient has not had these screenings before. -Order Hepatitis C and HIV screening tests.  Dental Health: Patient has not seen a dentist in a while. No immediate concerns noted during physical exam. -Recommend routine dental visit.  Follow-up: Provided everything looks good on labs, plan for next physical in one year. -Schedule next physical in one year.        Return in about 1 year (around 10/11/2023) for CPE.     Shirlee Latch, MD

## 2022-10-14 ENCOUNTER — Telehealth: Payer: Self-pay

## 2022-10-14 ENCOUNTER — Other Ambulatory Visit: Payer: Self-pay

## 2022-10-14 DIAGNOSIS — Z1211 Encounter for screening for malignant neoplasm of colon: Secondary | ICD-10-CM

## 2022-10-14 MED ORDER — NA SULFATE-K SULFATE-MG SULF 17.5-3.13-1.6 GM/177ML PO SOLN: 1.0000 | Freq: Once | ORAL | 0 refills | Status: AC

## 2022-10-14 NOTE — Telephone Encounter (Signed)
Gastroenterology Pre-Procedure Review  Request Date: 11/29/22 Requesting Physician: Dr. Allegra Lai  PATIENT REVIEW QUESTIONS: The patient responded to the following health history questions as indicated:    1. Are you having any GI issues? no 2. Do you have a personal history of Polyps? no 3. Do you have a family history of Colon Cancer or Polyps? yes (aunt colon cancer) 4. Diabetes Mellitus? no 5. Joint replacements in the past 12 months?no 6. Major health problems in the past 3 months?no 7. Any artificial heart valves, MVP, or defibrillator?no    MEDICATIONS & ALLERGIES:    Patient reports the following regarding taking any anticoagulation/antiplatelet therapy:   Plavix, Coumadin, Eliquis, Xarelto, Lovenox, Pradaxa, Brilinta, or Effient? no Aspirin? no  Patient confirms/reports the following medications:  Current Outpatient Medications  Medication Sig Dispense Refill   acetaminophen (TYLENOL) 325 MG tablet Take 650 mg by mouth every 4 (four) hours as needed.     No current facility-administered medications for this visit.    Patient confirms/reports the following allergies:  No Known Allergies  No orders of the defined types were placed in this encounter.   AUTHORIZATION INFORMATION Primary Insurance: 1D#: Group #:  Secondary Insurance: 1D#: Group #:  SCHEDULE INFORMATION: Date: 11/29/22 Time: Location: [facility]

## 2022-11-22 ENCOUNTER — Encounter: Payer: Self-pay | Admitting: Gastroenterology

## 2022-11-28 ENCOUNTER — Encounter: Payer: Self-pay | Admitting: Gastroenterology

## 2022-11-29 ENCOUNTER — Encounter: Payer: Self-pay | Admitting: Gastroenterology

## 2022-11-29 ENCOUNTER — Ambulatory Visit
Admission: RE | Admit: 2022-11-29 | Discharge: 2022-11-29 | Disposition: A | Discharge status: Home / Self Care | Payer: BC Managed Care – PPO | Attending: Gastroenterology | Admitting: Gastroenterology

## 2022-11-29 ENCOUNTER — Other Ambulatory Visit: Payer: Self-pay

## 2022-11-29 ENCOUNTER — Encounter
Admission: RE | Disposition: A | Discharge status: Home / Self Care | Payer: Self-pay | Source: Home / Self Care | Attending: Gastroenterology

## 2022-11-29 ENCOUNTER — Ambulatory Visit: Payer: BC Managed Care – PPO | Admitting: General Practice

## 2022-11-29 DIAGNOSIS — Z1211 Encounter for screening for malignant neoplasm of colon: Secondary | ICD-10-CM | POA: Diagnosis not present

## 2022-11-29 DIAGNOSIS — Z86718 Personal history of other venous thrombosis and embolism: Secondary | ICD-10-CM | POA: Insufficient documentation

## 2022-11-29 DIAGNOSIS — Z87891 Personal history of nicotine dependence: Secondary | ICD-10-CM | POA: Insufficient documentation

## 2022-11-29 DIAGNOSIS — Z800 Family history of malignant neoplasm of digestive organs: Secondary | ICD-10-CM | POA: Diagnosis not present

## 2022-11-29 HISTORY — PX: COLONOSCOPY WITH PROPOFOL: SHX5780

## 2022-11-29 SURGERY — COLONOSCOPY WITH PROPOFOL
Anesthesia: General

## 2022-11-29 MED ORDER — MIDAZOLAM HCL 2 MG/2ML IJ SOLN
INTRAMUSCULAR | Status: DC | PRN
  Administered 2022-11-29: 2 mg via INTRAVENOUS

## 2022-11-29 MED ORDER — PROPOFOL 10 MG/ML IV BOLUS
INTRAVENOUS | Status: DC | PRN
  Administered 2022-11-29 (×2): 20 mg via INTRAVENOUS
  Administered 2022-11-29: 60 mg via INTRAVENOUS

## 2022-11-29 MED ORDER — PROPOFOL 500 MG/50ML IV EMUL
INTRAVENOUS | Status: DC | PRN
  Administered 2022-11-29: 165 ug/kg/min via INTRAVENOUS

## 2022-11-29 MED ORDER — SODIUM CHLORIDE 0.9 % IV SOLN: INTRAVENOUS | Status: DC

## 2022-11-29 MED ORDER — LIDOCAINE HCL (CARDIAC) PF 100 MG/5ML IV SOSY
PREFILLED_SYRINGE | INTRAVENOUS | Status: DC | PRN
  Administered 2022-11-29: 100 mg via INTRAVENOUS

## 2022-11-29 MED ORDER — MIDAZOLAM HCL 2 MG/2ML IJ SOLN
INTRAMUSCULAR | Status: AC
  Filled 2022-11-29: qty 2

## 2022-11-29 NOTE — Transfer of Care (Signed)
Immediate Anesthesia Transfer of Care Note  Patient: Frank Marsh  Procedure(s) Performed: COLONOSCOPY WITH PROPOFOL  Patient Location: Endoscopy Unit  Anesthesia Type:General  Level of Consciousness: drowsy and patient cooperative  Airway & Oxygen Therapy: Patient Spontanous Breathing and Patient connected to face mask oxygen  Post-op Assessment: Report given to RN and Post -op Vital signs reviewed and stable  Post vital signs: Reviewed and stable  Last Vitals:  Vitals Value Taken Time  BP 105/59 11/29/22 1031  Temp 36.1 C 11/29/22 1028  Pulse 75 11/29/22 1031  Resp 21 11/29/22 1031  SpO2 99 % 11/29/22 1031  Vitals shown include unfiled device data.  Last Pain:  Vitals:   11/29/22 1030  TempSrc:   PainSc: Asleep         Complications: No notable events documented.

## 2022-11-29 NOTE — Anesthesia Preprocedure Evaluation (Signed)
Anesthesia Evaluation  Patient identified by MRN, date of birth, ID band Patient awake    Reviewed: Allergy & Precautions, NPO status , Patient's Chart, lab work & pertinent test results  Airway Mallampati: III  TM Distance: >3 FB Neck ROM: full    Dental  (+) Chipped   Pulmonary neg pulmonary ROS, Patient abstained from smoking., former smoker   Pulmonary exam normal        Cardiovascular negative cardio ROS Normal cardiovascular exam     Neuro/Psych negative neurological ROS  negative psych ROS   GI/Hepatic Neg liver ROS,GERD  Medicated,,  Endo/Other  negative endocrine ROS    Renal/GU negative Renal ROS  negative genitourinary   Musculoskeletal   Abdominal   Peds  Hematology negative hematology ROS (+)   Anesthesia Other Findings Past Medical History: No date: DVT (deep venous thrombosis) (HCC) No date: GERD (gastroesophageal reflux disease)     Comment:  OCC-NO MEDS No date: Headache     Comment:  OCC  Past Surgical History: 11/04/2016: ACHILLES TENDON SURGERY; Left     Comment:  Procedure: ACHILLES TENDON REPAIR;  Surgeon: Deeann Saint, MD;  Location: [facility] ORS;  Service: Orthopedics;                Laterality: Left; 1980: INGUINAL HERNIA REPAIR; Bilateral 2014: LIPOMA EXCISION; Right     Comment:  LEG, benign  BMI    Body Mass Index: 33.49 kg/m      Reproductive/Obstetrics negative OB ROS                             Anesthesia Physical Anesthesia Plan  ASA: 2  Anesthesia Plan: General   Post-op Pain Management: Minimal or no pain anticipated   Induction: Intravenous  PONV Risk Score and Plan: 3 and Propofol infusion, TIVA and Ondansetron  Airway Management Planned: Nasal Cannula  Additional Equipment: None  Intra-op Plan:   Post-operative Plan:   Informed Consent: I have reviewed the patients History and Physical, chart, labs and discussed  the procedure including the risks, benefits and alternatives for the proposed anesthesia with the patient or authorized representative who has indicated his/her understanding and acceptance.     Dental advisory given  Plan Discussed with: CRNA and Surgeon  Anesthesia Plan Comments: (Discussed risks of anesthesia with patient, including possibility of difficulty with spontaneous ventilation under anesthesia necessitating airway intervention, PONV, and rare risks such as cardiac or respiratory or neurological events, and allergic reactions. Discussed the role of CRNA in patient's perioperative care. Patient understands.)       Anesthesia Quick Evaluation

## 2022-11-29 NOTE — Op Note (Signed)
[facility] Gastroenterology Patient Name: Frank Marsh Procedure Date: 11/29/2022 10:02 AM MRN: [id number] Account #: [id number] Date of Birth: 1975/05/25 Admit Type: Outpatient Age: 47 Room: [facility] ENDO ROOM 2 Gender: Male Note Status: Finalized Instrument Name: Nelda Marseille [id number] Procedure:             Colonoscopy Indications:           Screening for colorectal malignant neoplasm, This is                         the patient's first colonoscopy Providers:             Toney Reil MD, MD Referring MD:          Marzella Schlein. Bacigalupo (Referring MD) Medicines:             General Anesthesia Complications:         No immediate complications. Estimated blood loss: None. Procedure:             Pre-Anesthesia Assessment:                        - Prior to the procedure, a History and Physical was                         performed, and patient medications and allergies were                         reviewed. The patient is competent. The risks and                         benefits of the procedure and the sedation options and                         risks were discussed with the patient. All questions                         were answered and informed consent was obtained.                         Patient identification and proposed procedure were                         verified by the physician, the nurse, the                         anesthesiologist, the anesthetist and the technician                         in the pre-procedure area in the procedure room in the                         endoscopy suite. Mental Status Examination: alert and                         oriented. Airway Examination: normal oropharyngeal                         airway and neck mobility. Respiratory Examination:  clear to auscultation. CV Examination: normal.                         Prophylactic Antibiotics: The patient does not require                         prophylactic  antibiotics. Prior Anticoagulants: The                         patient has taken no anticoagulant or antiplatelet                         agents. ASA Grade Assessment: II - A patient with mild                         systemic disease. After reviewing the risks and                         benefits, the patient was deemed in satisfactory                         condition to undergo the procedure. The anesthesia                         plan was to use general anesthesia. Immediately prior                         to administration of medications, the patient was                         re-assessed for adequacy to receive sedatives. The                         heart rate, respiratory rate, oxygen saturations,                         blood pressure, adequacy of pulmonary ventilation, and                         response to care were monitored throughout the                         procedure. The physical status of the patient was                         re-assessed after the procedure.                        After obtaining informed consent, the colonoscope was                         passed under direct vision. Throughout the procedure,                         the patient's blood pressure, pulse, and oxygen                         saturations were monitored continuously. The  Colonoscope was introduced through the anus and                         advanced to the the cecum, identified by appendiceal                         orifice and ileocecal valve. The colonoscopy was                         performed without difficulty. The patient tolerated                         the procedure well. The quality of the bowel                         preparation was evaluated using the BBPS [facility] Bowel                         Preparation Scale) with scores of: Right Colon = 3,                         Transverse Colon = 3 and Left Colon = 3 (entire mucosa                         seen  well with no residual staining, small fragments                         of stool or opaque liquid). The total BBPS score                         equals 9. The ileocecal valve, appendiceal orifice,                         and rectum were photographed. Findings:      The perianal and digital rectal examinations were normal. Pertinent       negatives include normal sphincter tone and no palpable rectal lesions.      The entire examined colon appeared normal.      The retroflexed view of the distal rectum and anal verge was normal and       showed no anal or rectal abnormalities. Impression:            - The entire examined colon is normal.                        - The distal rectum and anal verge are normal on                         retroflexion view.                        - No specimens collected. Recommendation:        - Discharge patient to home (with escort).                        - Resume previous diet today.                        -  Continue present medications.                        - Repeat colonoscopy in 10 years for screening                         purposes. Procedure Code(s):     --- Professional ---                        Z6109, Colorectal cancer screening; colonoscopy on                         individual not meeting criteria for high risk Diagnosis Code(s):     --- Professional ---                        Z12.11, Encounter for screening for malignant neoplasm                         of colon CPT copyright 2022 American Medical Association. All rights reserved. The codes documented in this report are preliminary and upon coder review may  be revised to meet current compliance requirements. Dr. Libby Maw Toney Reil MD, MD 11/29/2022 10:25:52 AM This report has been signed electronically. Number of Addenda: 0 Note Initiated On: 11/29/2022 10:02 AM Scope Withdrawal Time: 0 hours 5 minutes 46 seconds  Total Procedure Duration: 0 hours 8 minutes 14 seconds   Estimated Blood Loss:  Estimated blood loss: none.      [facility]

## 2022-11-29 NOTE — H&P (Signed)
Frank Repress, MD [address]  [address]  [place], [place] [address]  Main: [phone]  Fax: [phone] Pager: [phone]  Primary Care Physician:  Erasmo Downer, MD Primary Gastroenterologist:  Dr. Arlyss Marsh  Pre-Procedure History & Physical: HPI:  Emit Lykins is a 47 y.o. male is here for an colonoscopy.   Past Medical History:  Diagnosis Date   DVT (deep venous thrombosis) (HCC)    GERD (gastroesophageal reflux disease)    OCC-NO MEDS   Headache    OCC    Past Surgical History:  Procedure Laterality Date   ACHILLES TENDON SURGERY Left 11/04/2016   Procedure: ACHILLES TENDON REPAIR;  Surgeon: Deeann Saint, MD;  Location: [facility] ORS;  Service: Orthopedics;  Laterality: Left;   INGUINAL HERNIA REPAIR Bilateral 1980   LIPOMA EXCISION Right 2014   LEG, benign    Prior to Admission medications   Medication Sig Start Date End Date Taking? Authorizing Provider  acetaminophen (TYLENOL) 325 MG tablet Take 650 mg by mouth every 4 (four) hours as needed.    [provider]    Allergies as of 10/14/2022   (No Known Allergies)    Family History  Problem Relation Age of Onset   Healthy Mother    Mesothelioma Father        smoker, asbestos exposure   Healthy Sister    Healthy Brother    Hypertension Maternal Grandmother    Renal Disease Maternal Grandmother        on HD   Breast cancer Maternal Grandmother        s/p masectomy   Prostate cancer Paternal Grandfather 41   Healthy Brother    Heart disease Maternal Grandfather 35   Heart disease Paternal Grandmother 9   Colon cancer Maternal Aunt 35   Throat cancer Maternal Uncle        smoker    Social History   Socioeconomic History   Marital status: Single    Spouse name: Not on file   Number of children: 2   Years of education: Not on file   Highest education level: GED or equivalent  Occupational History   Not on file  Tobacco Use   Smoking status: Former    Current  packs/day: 0.00    Average packs/day: 1 pack/day for 15.0 years (15.0 ttl pk-yrs)    Types: Cigarettes    Start date: 11/02/1994    Quit date: 11/01/2009    Years since quitting: 13.0   Smokeless tobacco: Never  Vaping Use   Vaping status: Some Days  Substance and Sexual Activity   Alcohol use: Yes    Comment: none last 24hrs   Drug use: Yes    Types: Marijuana   Sexual activity: Not on file  Other Topics Concern   Not on file  Social History Narrative   Not on file   Social Determinants of Health   Financial Resource Strain: Low Risk  (09/12/2022)   Overall Financial Resource Strain (CARDIA)    Difficulty of Paying Living Expenses: Not hard at all  Food Insecurity: No Food Insecurity (09/12/2022)   Hunger Vital Sign    Worried About Running Out of Food in the Last Year: Never true    Ran Out of Food in the Last Year: Never true  Transportation Needs: No Transportation Needs (09/12/2022)   PRAPARE - Administrator, Civil Service (Medical): No    Lack of Transportation (Non-Medical): No  Physical Activity: Insufficiently Active (  09/12/2022)   Exercise Vital Sign    Days of Exercise per Week: 4 days    Minutes of Exercise per Session: 30 min  Stress: Stress Concern Present (09/12/2022)   [employer] of Occupational Health - Occupational Stress Questionnaire    Feeling of Stress : To some extent  Social Connections: Moderately Isolated (09/12/2022)   Social Connection and Isolation Panel [NHANES]    Frequency of Communication with Friends and Family: Twice a week    Frequency of Social Gatherings with Friends and Family: Never    Attends Religious Services: 1 to 4 times per year    Active Member of [employer] or Organizations: No    Attends Engineer, structural: Not on file    Marital Status: Married  Intimate Partner Violence: Unknown (06/22/2021)   Received from [facility]   HITS    Physically Hurt: Not on file    Insult or Talk Down To: Not on file     Threaten Physical Harm: Not on file    Scream or Curse: Not on file    Review of Systems: See HPI, otherwise negative ROS  Physical Exam: BP 128/86   Pulse 69   Temp (!) 96.7 F (35.9 C) (Temporal)   Resp 20   Ht 5\' 9"  (1.753 m)   Wt 102.9 kg   SpO2 98%   BMI 33.49 kg/m  General:   Alert,  pleasant and cooperative in NAD Head:  Normocephalic and atraumatic. Neck:  Supple; no masses or thyromegaly. Lungs:  Clear throughout to auscultation.    Heart:  Regular rate and rhythm. Abdomen:  Soft, nontender and nondistended. Normal bowel sounds, without guarding, and without rebound.   Neurologic:  Alert and  oriented x4;  grossly normal neurologically.  Impression/Plan: Deago Escobedo is here for an colonoscopy to be performed for colon cancer screening  Risks, benefits, limitations, and alternatives regarding  colonoscopy have been reviewed with the patient.  Questions have been answered.  All parties agreeable.   Lannette Donath, MD  11/29/2022, 9:59 AM

## 2022-11-29 NOTE — Anesthesia Postprocedure Evaluation (Signed)
Anesthesia Post Note  Patient: Frank Marsh  Procedure(s) Performed: COLONOSCOPY WITH PROPOFOL  Patient location during evaluation: PACU Anesthesia Type: General Level of consciousness: awake and alert Pain management: pain level controlled Vital Signs Assessment: post-procedure vital signs reviewed and stable Respiratory status: spontaneous breathing, nonlabored ventilation, respiratory function stable and patient connected to nasal cannula oxygen Cardiovascular status: blood pressure returned to baseline and stable Postop Assessment: no apparent nausea or vomiting Anesthetic complications: no  No notable events documented.   Last Vitals:  Vitals:   11/29/22 1028 11/29/22 1030  BP:  (!) 105/59  Pulse:    Resp:    Temp: (!) 36.1 C   SpO2:      Last Pain:  Vitals:   11/29/22 1030  TempSrc:   PainSc: Asleep                 Stephanie Coup

## 2022-11-29 NOTE — Anesthesia Procedure Notes (Signed)
Procedure Name: General with mask airway Date/Time: 11/29/2022 10:15 AM  Performed by: Mohammed Kindle, CRNAPre-anesthesia Checklist: Patient identified, Emergency Drugs available, Suction available and Patient being monitored Patient Re-evaluated:Patient Re-evaluated prior to induction Oxygen Delivery Method: Simple face mask Induction Type: IV induction Placement Confirmation: positive ETCO2, CO2 detector and breath sounds checked- equal and bilateral Dental Injury: Teeth and Oropharynx as per pre-operative assessment

## 2022-12-02 ENCOUNTER — Encounter: Payer: Self-pay | Admitting: Gastroenterology
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
## 2024-04-14 ENCOUNTER — Telehealth: Payer: Self-pay
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
## 2024-04-15 MED ORDER — SCOPOLAMINE 1 MG/3DAYS TD PT72: 1.0000 | MEDICATED_PATCH | TRANSDERMAL | 0 refills | Status: AC

## 2024-04-29 ENCOUNTER — Ambulatory Visit: Admitting: Family Medicine
